# Patient Record
Sex: Female | Born: 1990 | Race: Black or African American | Hispanic: Yes | Marital: Single | Smoking: Never smoker
Health system: Southern US, Community
[De-identification: ages and names within clinical notes are randomized; demographics above are authoritative.]

## PROBLEM LIST (undated history)

## (undated) ENCOUNTER — Inpatient Hospital Stay (HOSPITAL_COMMUNITY): Payer: Self-pay

## (undated) DIAGNOSIS — R569 Unspecified convulsions: Secondary | ICD-10-CM

## (undated) DIAGNOSIS — F32A Depression, unspecified: Secondary | ICD-10-CM

## (undated) DIAGNOSIS — Q796 Ehlers-Danlos syndrome, unspecified: Secondary | ICD-10-CM

## (undated) DIAGNOSIS — N63 Unspecified lump in unspecified breast: Secondary | ICD-10-CM

## (undated) HISTORY — PX: NO PAST SURGERIES: SHX2092

## (undated) HISTORY — PX: BREAST LUMPECTOMY: SHX2

## (undated) HISTORY — DX: Unspecified lump in unspecified breast: N63.0

---
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
## 2011-03-04 ENCOUNTER — Emergency Department (HOSPITAL_COMMUNITY)
Admission: EM | Admit: 2011-03-04 | Discharge: 2011-03-04 | Disposition: A | Discharge status: Home / Self Care | Attending: Emergency Medicine | Admitting: Emergency Medicine

## 2011-03-04 DIAGNOSIS — N390 Urinary tract infection, site not specified: Secondary | ICD-10-CM | POA: Insufficient documentation

## 2011-03-04 DIAGNOSIS — R109 Unspecified abdominal pain: Secondary | ICD-10-CM | POA: Insufficient documentation

## 2011-03-04 DIAGNOSIS — N949 Unspecified condition associated with female genital organs and menstrual cycle: Secondary | ICD-10-CM | POA: Insufficient documentation

## 2011-03-04 LAB — URINALYSIS, ROUTINE W REFLEX MICROSCOPIC
Bilirubin Urine: NEGATIVE
Glucose, UA: NEGATIVE mg/dL
Hgb urine dipstick: NEGATIVE
Ketones, ur: NEGATIVE mg/dL
Nitrite: POSITIVE — AB
Protein, ur: NEGATIVE mg/dL
Specific Gravity, Urine: 1.028 (ref 1.005–1.030)
Urobilinogen, UA: 0.2 mg/dL (ref 0.0–1.0)
pH: 5.5 (ref 5.0–8.0)

## 2011-03-04 LAB — DIFFERENTIAL
Basophils Absolute: 0 10*3/uL (ref 0.0–0.1)
Basophils Relative: 0 % (ref 0–1)
Eosinophils Absolute: 0.1 10*3/uL (ref 0.0–0.7)
Eosinophils Relative: 1 % (ref 0–5)
Lymphocytes Relative: 22 % (ref 12–46)
Lymphs Abs: 1.2 10*3/uL (ref 0.7–4.0)
Monocytes Absolute: 0.3 10*3/uL (ref 0.1–1.0)
Monocytes Relative: 6 % (ref 3–12)
Neutro Abs: 3.8 10*3/uL (ref 1.7–7.7)
Neutrophils Relative %: 71 % (ref 43–77)

## 2011-03-04 LAB — COMPREHENSIVE METABOLIC PANEL
ALT: 5 U/L (ref 0–35)
AST: 12 U/L (ref 0–37)
Albumin: 3.7 g/dL (ref 3.5–5.2)
Alkaline Phosphatase: 56 U/L (ref 39–117)
BUN: 11 mg/dL (ref 6–23)
CO2: 25 mEq/L (ref 19–32)
Calcium: 9 mg/dL (ref 8.4–10.5)
Chloride: 103 mEq/L (ref 96–112)
Creatinine, Ser: 0.64 mg/dL (ref 0.50–1.10)
GFR calc Af Amer: >90 mL/min (ref >90)
GFR calc non Af Amer: >90 mL/min (ref >90)
Glucose, Bld: 81 mg/dL (ref 70–99)
Potassium: 3.7 mEq/L (ref 3.5–5.1)
Sodium: 137 mEq/L (ref 135–145)
Total Bilirubin: 0.5 mg/dL (ref 0.3–1.2)
Total Protein: 6.3 g/dL (ref 6.0–8.3)

## 2011-03-04 LAB — CBC
HCT: 36.9 % (ref 36.0–46.0)
Hemoglobin: 12.8 g/dL (ref 12.0–15.0)
MCH: 26.9 pg (ref 26.0–34.0)
MCHC: 34.7 g/dL (ref 30.0–36.0)
MCV: 77.7 fL — ABNORMAL LOW (ref 78.0–100.0)
Platelets: 222 10*3/uL (ref 150–400)
RBC: 4.75 MIL/uL (ref 3.87–5.11)
RDW: 12.7 % (ref 11.5–15.5)
WBC: 5.4 10*3/uL (ref 4.0–10.5)

## 2011-03-04 LAB — URINE MICROSCOPIC-ADD ON

## 2011-03-04 LAB — WET PREP, GENITAL
Clue Cells Wet Prep HPF POC: NONE SEEN
Trich, Wet Prep: NONE SEEN
Yeast Wet Prep HPF POC: NONE SEEN

## 2011-03-04 LAB — POCT PREGNANCY, URINE: Preg Test, Ur: NEGATIVE

## 2011-03-05 LAB — GC/CHLAMYDIA PROBE AMP, GENITAL
Chlamydia, DNA Probe: NEGATIVE
GC Probe Amp, Genital: NEGATIVE

## 2011-03-06 LAB — URINE CULTURE
Colony Count: >=100000
Culture  Setup Time: 201210121141

## 2011-04-11 ENCOUNTER — Emergency Department (HOSPITAL_COMMUNITY)
Admission: EM | Admit: 2011-04-11 | Discharge: 2011-04-11 | Disposition: A | Discharge status: Home / Self Care | Attending: Emergency Medicine | Admitting: Emergency Medicine

## 2011-04-11 ENCOUNTER — Encounter: Payer: Self-pay | Admitting: Family Medicine

## 2011-04-11 DIAGNOSIS — T887XXA Unspecified adverse effect of drug or medicament, initial encounter: Secondary | ICD-10-CM | POA: Insufficient documentation

## 2011-04-11 DIAGNOSIS — T50905A Adverse effect of unspecified drugs, medicaments and biological substances, initial encounter: Secondary | ICD-10-CM

## 2011-04-11 DIAGNOSIS — I10 Essential (primary) hypertension: Secondary | ICD-10-CM | POA: Insufficient documentation

## 2011-04-11 DIAGNOSIS — R42 Dizziness and giddiness: Secondary | ICD-10-CM | POA: Insufficient documentation

## 2011-04-11 HISTORY — DX: Unspecified convulsions: R56.9

## 2011-04-11 LAB — CBC
HCT: 37 % (ref 36.0–46.0)
Hemoglobin: 11.9 g/dL — ABNORMAL LOW (ref 12.0–15.0)
MCH: 25.3 pg — ABNORMAL LOW (ref 26.0–34.0)
MCHC: 32.2 g/dL (ref 30.0–36.0)
MCV: 78.7 fL (ref 78.0–100.0)
Platelets: 276 10*3/uL (ref 150–400)
RBC: 4.7 MIL/uL (ref 3.87–5.11)
RDW: 12.6 % (ref 11.5–15.5)
WBC: 4.9 10*3/uL (ref 4.0–10.5)

## 2011-04-11 LAB — BASIC METABOLIC PANEL
BUN: 9 mg/dL (ref 6–23)
CO2: 25 mEq/L (ref 19–32)
Calcium: 9.2 mg/dL (ref 8.4–10.5)
Chloride: 105 mEq/L (ref 96–112)
Creatinine, Ser: 0.77 mg/dL (ref 0.50–1.10)
GFR calc Af Amer: >90 mL/min (ref >90)
GFR calc non Af Amer: >90 mL/min (ref >90)
Glucose, Bld: 87 mg/dL (ref 70–99)
Potassium: 3.6 mEq/L (ref 3.5–5.1)
Sodium: 138 mEq/L (ref 135–145)

## 2011-04-11 LAB — URINALYSIS, ROUTINE W REFLEX MICROSCOPIC
Bilirubin Urine: NEGATIVE
Glucose, UA: NEGATIVE mg/dL
Hgb urine dipstick: NEGATIVE
Ketones, ur: NEGATIVE mg/dL
Nitrite: POSITIVE — AB
Protein, ur: NEGATIVE mg/dL
Specific Gravity, Urine: 1.016 (ref 1.005–1.030)
Urobilinogen, UA: 0.2 mg/dL (ref 0.0–1.0)
pH: 7 (ref 5.0–8.0)

## 2011-04-11 LAB — URINE MICROSCOPIC-ADD ON

## 2011-04-11 LAB — RAPID URINE DRUG SCREEN, HOSP PERFORMED
Amphetamines: NOT DETECTED
Barbiturates: NOT DETECTED
Benzodiazepines: NOT DETECTED
Cocaine: NOT DETECTED
Opiates: NOT DETECTED
Tetrahydrocannabinol: NOT DETECTED

## 2011-04-11 LAB — PREGNANCY, URINE: Preg Test, Ur: NEGATIVE

## 2011-04-11 LAB — ETHANOL: Alcohol, Ethyl (B): <11 mg/dL (ref 0–11)

## 2011-04-11 MED ORDER — ONDANSETRON HCL 4 MG PO TABS: 8.0000 mg | ORAL_TABLET | Freq: Once | ORAL | Status: DC

## 2011-04-11 MED ORDER — ONDANSETRON HCL 4 MG PO TABS: 4.0000 mg | ORAL_TABLET | Freq: Four times a day (QID) | ORAL | Status: AC

## 2011-04-11 MED ORDER — ONDANSETRON 8 MG PO TBDP
ORAL_TABLET | ORAL | Status: AC
  Administered 2011-04-11: 21:00:00
  Filled 2011-04-11: qty 1

## 2011-04-11 NOTE — ED Notes (Signed)
Transported to xray 

## 2011-04-11 NOTE — ED Notes (Signed)
Per EMS: pt from school. Reports having dizziness since 15:00 today.

## 2011-04-11 NOTE — ED Notes (Signed)
ZOX:[id number]<AV> Expected date:04/11/11<BR> Expected time: 5:45 PM<BR> Means of arrival:Ambulance<BR> Comments:<BR> EMS 50 GC  20 yof w dizziness/? seizure

## 2011-04-11 NOTE — ED Provider Notes (Signed)
History     CSN: [id number] Arrival date & time: 04/11/2011  5:57 PM   First MD Initiated Contact with Patient 04/11/11 1818      Chief Complaint  Patient presents with  . Dizziness    (Consider location/radiation/quality/duration/timing/severity/associated sxs/prior treatment) HPI Patient reports she was nauseated earlier today and she was given Aleve and Phenergan from her sister.  She currently is feeling dizzy and drowsy.  No longer has nausea.  She denies abdominal pain she denies dysuria.  She denies urinary frequency.  She denies diarrhea.  She is actively on her menstrual period per the patient she reports no current sexual activity.  Nothing worsens her symptoms.  Nothing improves her symptoms.  Her symptoms are mild to moderate in severity.  12  Past Medical History  Diagnosis Date  . Seizures     History reviewed. No pertinent past surgical history.  History reviewed. No pertinent family history.  History  Substance Use Topics  . Smoking status: Never Smoker   . Smokeless tobacco: Not on file  . Alcohol Use: No    OB History    Grav Para Term Preterm Abortions TAB SAB Ect Mult Living                  Review of Systems  All other systems reviewed and are negative.    Allergies  Penicillins  Home Medications   Current Outpatient Rx  Name Route Sig Dispense Refill  . IBUPROFEN 200 MG PO TABS Oral Take 400 mg by mouth every 6 (six) hours as needed. pain       BP 130/82  Pulse 68  Temp(Src) 97.4 F (36.3 C) (Oral)  Resp 16  SpO2 100%  LMP 04/11/2011  Physical Exam  Nursing note and vitals reviewed. Constitutional: She is oriented to person, place, and time. She appears well-developed and well-nourished.  HENT:  Head: Normocephalic and atraumatic.  Eyes: Pupils are equal, round, and reactive to light.  Cardiovascular: Regular rhythm.   Pulmonary/Chest: Effort normal.  Abdominal: Soft.  Neurological: She is alert and oriented to person,  place, and time.       5/5 strength in major muscle groups of  bilateral upper and lower extremities. Speech normal. No facial asymetry.     ED Course  Procedures (including critical care time)  Labs Reviewed  CBC - Abnormal; Notable for the following:    Hemoglobin 11.9 (*)    MCH 25.3 (*)    All other components within normal limits  PREGNANCY, URINE  URINE RAPID DRUG SCREEN (HOSP PERFORMED)  ETHANOL  BASIC METABOLIC PANEL  URINALYSIS, ROUTINE W REFLEX MICROSCOPIC   No results found.   1. Medication reaction       MDM  I suspect this is medication related.  We'll obtain urine and urine pregnancy and basic labs including urine drug and EtOH.  He normal neurologic exam however does appear drowsy.  Her abdomen is benign on exam  9:15 PM Pt is much more alert. Took her sisters phenergan. Labs and urine normal. Home with antiemtics. Mother to take the patient home        Lyanne Co, MD 04/11/11 2116

## 2011-04-12 ENCOUNTER — Encounter (HOSPITAL_COMMUNITY): Payer: Self-pay | Admitting: Emergency Medicine

## 2011-04-12 ENCOUNTER — Emergency Department (INDEPENDENT_AMBULATORY_CARE_PROVIDER_SITE_OTHER)
Admission: EM | Admit: 2011-04-12 | Discharge: 2011-04-12 | Disposition: A | Discharge status: Home / Self Care | Source: Home / Self Care

## 2011-04-12 DIAGNOSIS — B349 Viral infection, unspecified: Secondary | ICD-10-CM

## 2011-04-12 DIAGNOSIS — B9789 Other viral agents as the cause of diseases classified elsewhere: Secondary | ICD-10-CM

## 2011-04-12 HISTORY — DX: Ehlers-Danlos syndrome, unspecified: Q79.60

## 2011-04-12 NOTE — ED Provider Notes (Signed)
History     CSN: [id number] Arrival date & time: 04/12/2011  1:39 PM   None     Chief Complaint  Patient presents with  . Generalized Body Aches    (Consider location/radiation/quality/duration/timing/severity/associated sxs/prior treatment) HPI Comments: Pt states she began feeling lightheaded and weak yesterday. Today has chills, HA and body aches also. Was seen at [facility] ED yesterday. No cough, sore throat, abd pain, N/V/D, dysuria. Does admit to mild nasal congestion.Having normal menses currently. Appetite is normal. Pt and her family are requesting a referral to [facility] to establish with a PCP. Pt states she believes her sister called [facility] for an appt, as she herself did not call, and her sister was told that she needed a referral.    Past Medical History  Diagnosis Date  . Seizures   . Ehler's-Danlos syndrome     History reviewed. No pertinent past surgical history.  History reviewed. No pertinent family history.  History  Substance Use Topics  . Smoking status: Never Smoker   . Smokeless tobacco: Not on file  . Alcohol Use: No    OB History    Grav Para Term Preterm Abortions TAB SAB Ect Mult Living                  Review of Systems  Constitutional: Positive for fever, chills and fatigue. Negative for appetite change.  HENT: Positive for congestion. Negative for ear pain, sore throat, rhinorrhea, postnasal drip and sinus pressure.   Respiratory: Negative for cough and shortness of breath.   Cardiovascular: Negative for chest pain.  Gastrointestinal: Positive for nausea (mild, intermittent). Negative for vomiting, abdominal pain, diarrhea and constipation.  Genitourinary: Negative for dysuria, urgency, frequency, decreased urine volume and menstrual problem.    Allergies  Penicillins  Home Medications   Current Outpatient Rx  Name Route Sig Dispense Refill  . IBUPROFEN 200 MG PO TABS Oral Take 400 mg by mouth every 6 (six) hours  as needed. pain     . ONDANSETRON HCL 4 MG PO TABS Oral Take 1 tablet (4 mg total) by mouth every 6 (six) hours. 12 tablet 0    BP 95/59  Pulse 134  Temp(Src) 100.7 F (38.2 C) (Oral)  Resp 20  SpO2 100%  LMP 04/11/2011  Physical Exam  Nursing note and vitals reviewed. Constitutional: She appears well-developed and well-nourished. No distress.  HENT:  Head: Normocephalic and atraumatic.  Right Ear: Tympanic membrane, external ear and ear canal normal.  Left Ear: Tympanic membrane, external ear and ear canal normal.  Nose: Nose normal.  Mouth/Throat: Uvula is midline, oropharynx is clear and moist and mucous membranes are normal. No oropharyngeal exudate, posterior oropharyngeal edema or posterior oropharyngeal erythema.  Neck: Neck supple.  Cardiovascular: Normal rate, regular rhythm and normal heart sounds.   Pulmonary/Chest: Effort normal and breath sounds normal. No respiratory distress.  Abdominal: Normal appearance and bowel sounds are normal. She exhibits no mass. There is no hepatosplenomegaly. There is no tenderness. There is no CVA tenderness.  Lymphadenopathy:    She has no cervical adenopathy.  Neurological: She is alert.  Skin: Skin is warm and dry.  Psychiatric: She has a normal mood and affect.    ED Course  Procedures (including critical care time)  Labs Reviewed - No data to display No results found.   1. Viral infection       MDM  Low grade fever, with neg exam. Pt eating and drinking well.  ED visit yesterday, including labs reviewed.         Melody Comas, [place] 04/12/11 [phone]

## 2011-04-12 NOTE — ED Provider Notes (Signed)
Medical screening examination/treatment/procedure(s) were performed by non-physician practitioner and as supervising physician I was immediately available for consultation/collaboration.  Luiz Blare MD   Luiz Blare, MD 04/12/11 2224

## 2011-04-12 NOTE — ED Notes (Signed)
Patient reports getting up and coming directly to [facility].  Patient has not eat today.  Reports she just got up and came directly over. Patient reports a month of intermittent not feeling well, intermittent cold symptoms: runny nose, cough, generalized weakness.  Today reports "shaking, weak, and dizzy".  Yesterday had nausea and headache.  Patient reports father told her she had similar symptoms prior to seizures as a child.    Patient has a hand written note from older sister requesting referral to [facility].  Per hand written letter, family concerned about possibility of seizures.  Patient has not had seizure since being an adult

## 2011-05-23 IMAGING — CR DG CHEST 2V
2 series · 2 of 2 positions shown · non-contrast
Comparison: 09/14/2007.

CLINICAL DATA: Right-sided chest pain.

CHEST - 2 VIEW

[view not recorded (1 of 2)]
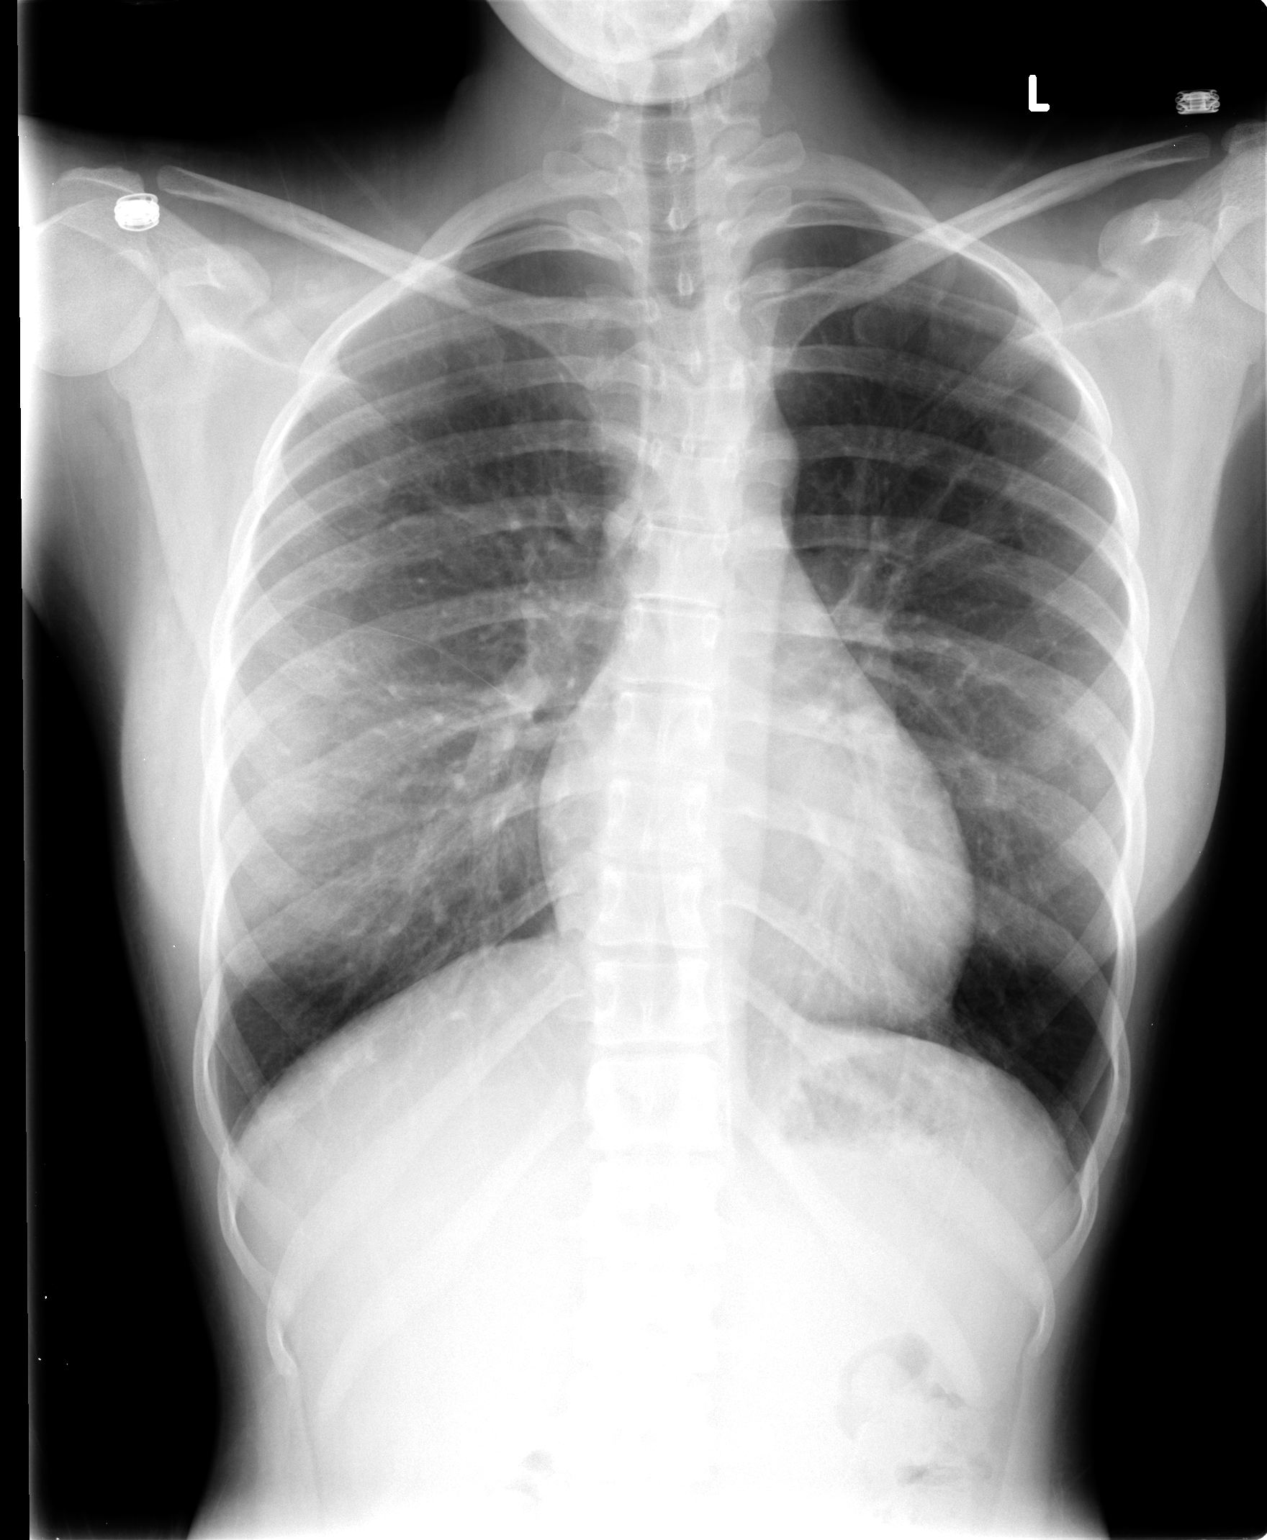

[view not recorded (2 of 2)]
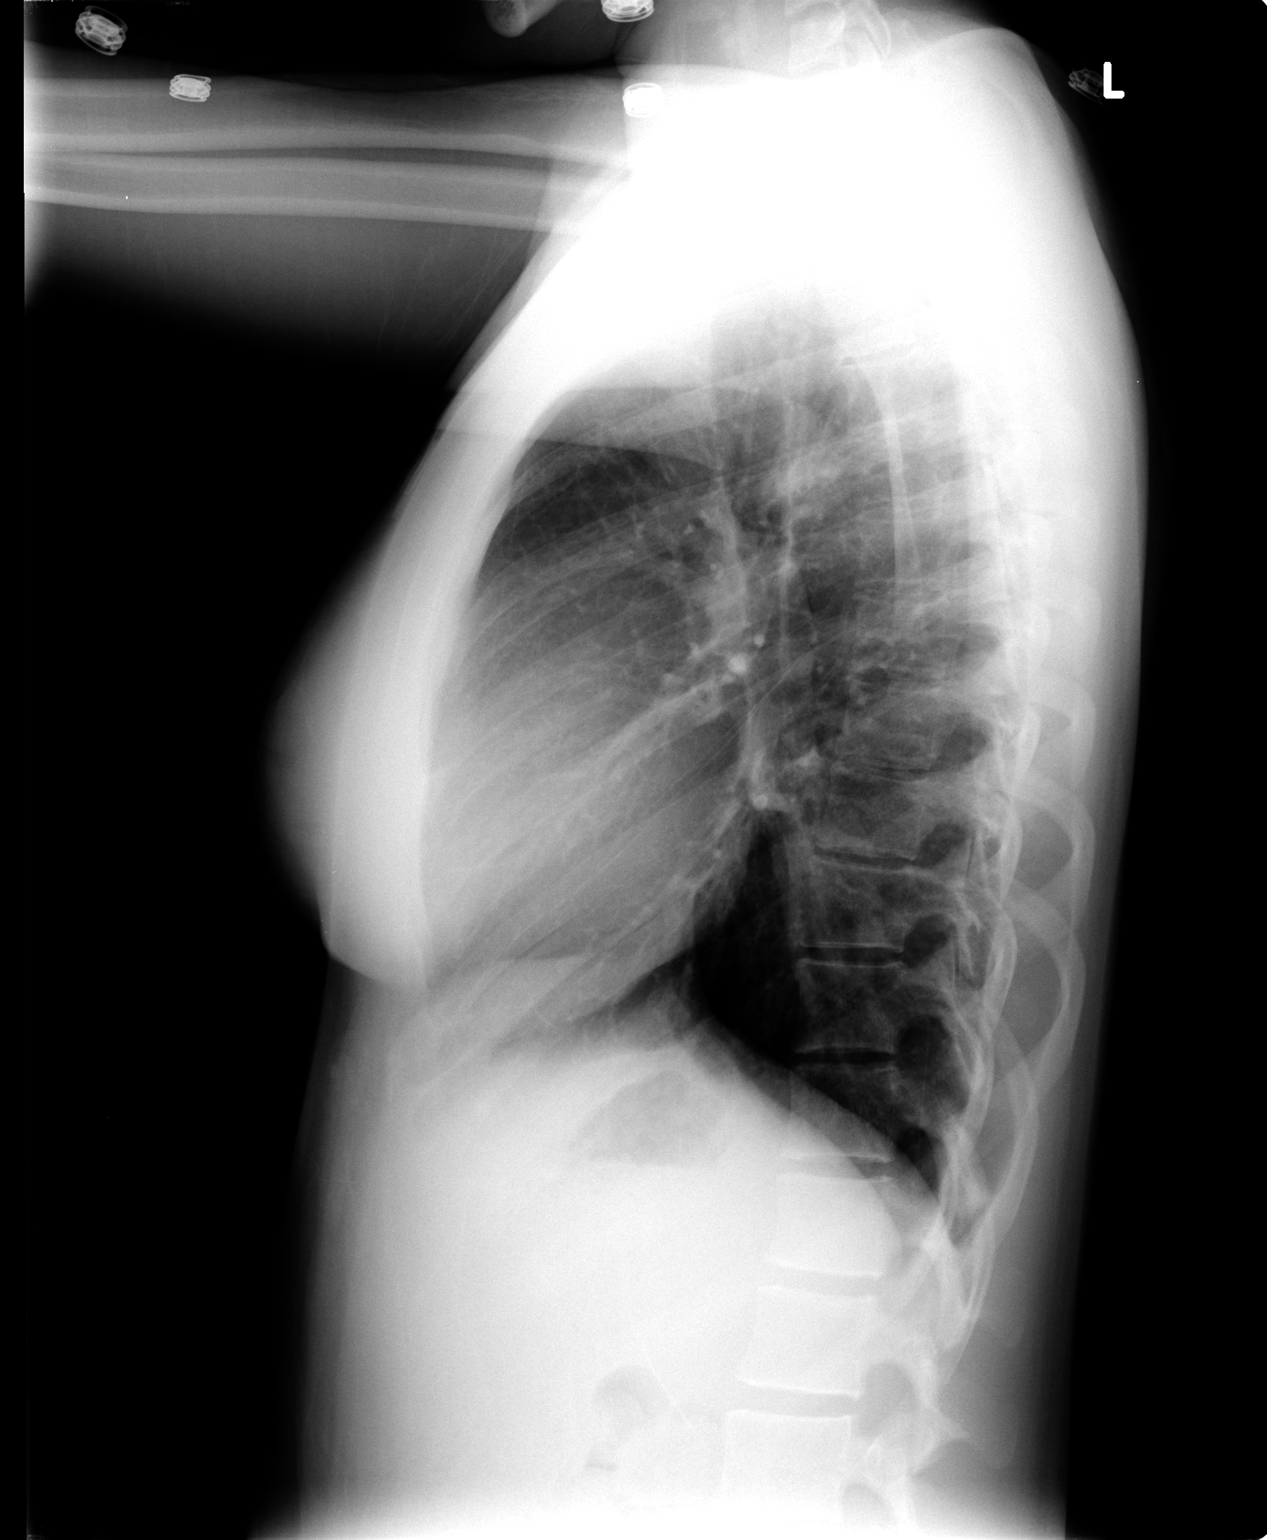

[2 of 2 positions shown; findings below may reference images not displayed]

FINDINGS: The lungs are clear without focal consolidation, edema,
effusion or pneumothorax.  Cardiopericardial silhouette is within
normal limits for size.  Imaged bony structures of the thorax are
intact.
IMPRESSION: No acute cardiopulmonary process.

## 2011-11-11 ENCOUNTER — Emergency Department (INDEPENDENT_AMBULATORY_CARE_PROVIDER_SITE_OTHER)
Admission: EM | Admit: 2011-11-11 | Discharge: 2011-11-11 | Disposition: A | Discharge status: Home / Self Care | Source: Home / Self Care | Attending: Emergency Medicine | Admitting: Emergency Medicine

## 2011-11-11 ENCOUNTER — Encounter (HOSPITAL_COMMUNITY): Payer: Self-pay

## 2011-11-11 DIAGNOSIS — J020 Streptococcal pharyngitis: Secondary | ICD-10-CM

## 2011-11-11 LAB — POCT RAPID STREP A: Streptococcus, Group A Screen (Direct): POSITIVE — AB

## 2011-11-11 MED ORDER — AZITHROMYCIN 250 MG PO TABS: ORAL_TABLET | ORAL | Status: DC

## 2011-11-11 NOTE — ED Notes (Signed)
C/o pain inleft ear, pain w swallowing, both tonsils swollen, exudative, uvula midline

## 2011-11-11 NOTE — ED Provider Notes (Signed)
Medical screening examination/treatment/procedure(s) were performed by non-physician practitioner and as supervising physician I was immediately available for consultation/collaboration.  Leslee Home, M.D.   Reuben Likes, MD 11/11/11 [phone]

## 2011-11-11 NOTE — ED Provider Notes (Signed)
History     CSN: [id number]  Arrival date & time 11/11/11  1334   First MD Initiated Contact with Patient 11/11/11 1531      Chief Complaint  Patient presents with  . Sore Throat    (Consider location/radiation/quality/duration/timing/severity/associated sxs/prior treatment) Patient is a 21 y.o. female presenting with pharyngitis. The history is provided by the patient. No language interpreter was used.  Sore Throat This is a new problem. The current episode started 2 days ago. The problem occurs constantly. The problem has not changed since onset.Nothing aggravates the symptoms. Nothing relieves the symptoms.   patient complains of left ear pain. Patient complains of swollen tonsils  Past Medical History  Diagnosis Date  . Seizures   . Ehler's-Danlos syndrome     History reviewed. No pertinent past surgical history.  History reviewed. No pertinent family history.  History  Substance Use Topics  . Smoking status: Never Smoker   . Smokeless tobacco: Not on file  . Alcohol Use: No    OB History    Grav Para Term Preterm Abortions TAB SAB Ect Mult Living                  Review of Systems  All other systems reviewed and are negative.    Allergies  Penicillins  Home Medications   Current Outpatient Rx  Name Route Sig Dispense Refill  . IBUPROFEN 200 MG PO TABS Oral Take 400 mg by mouth every 6 (six) hours as needed. pain       BP 109/75  Pulse 96  Temp 100.2 F (37.9 C) (Oral)  Resp 18  SpO2 97%  LMP 11/06/2011  Physical Exam  Constitutional: She appears well-developed and well-nourished.  HENT:  Head: Normocephalic and atraumatic.  Right Ear: External ear normal.  Left Ear: External ear normal.  Mouth/Throat: Oropharyngeal exudate present.       Swollen bilateral tonsils small amount of exudate  Eyes: Conjunctivae and EOM are normal. Pupils are equal, round, and reactive to light.  Neck: Normal range of motion. Neck supple.  Cardiovascular:  Normal rate.   Pulmonary/Chest: Effort normal.  Abdominal: Soft.  Musculoskeletal: Normal range of motion.  Neurological: She is alert.  Skin: Skin is warm.  Psychiatric: She has a normal mood and affect.    ED Course  Procedures (including critical care time)  Labs Reviewed  POCT RAPID STREP A ([facility] CARE ONLY) - Abnormal; Notable for the following:    Streptococcus, Group A Screen (Direct) POSITIVE (*)     All other components within normal limits   No results found.   No diagnosis found.    MDM  zithrmoax pack        Lonia Skinner Haynes, [place] 11/11/11 1535

## 2011-11-11 NOTE — Discharge Instructions (Signed)

## 2012-01-14 ENCOUNTER — Encounter (HOSPITAL_COMMUNITY): Payer: Self-pay | Admitting: Family Medicine

## 2012-01-14 ENCOUNTER — Emergency Department (HOSPITAL_COMMUNITY)

## 2012-01-14 ENCOUNTER — Emergency Department (HOSPITAL_COMMUNITY)
Admission: EM | Admit: 2012-01-14 | Discharge: 2012-01-14 | Disposition: A | Discharge status: Home / Self Care | Attending: Emergency Medicine | Admitting: Emergency Medicine

## 2012-01-14 DIAGNOSIS — Z3490 Encounter for supervision of normal pregnancy, unspecified, unspecified trimester: Secondary | ICD-10-CM

## 2012-01-14 DIAGNOSIS — O200 Threatened abortion: Secondary | ICD-10-CM | POA: Insufficient documentation

## 2012-01-14 LAB — CBC WITH DIFFERENTIAL/PLATELET
Basophils Absolute: 0 10*3/uL (ref 0.0–0.1)
Basophils Relative: 0 % (ref 0–1)
Eosinophils Absolute: 0 10*3/uL (ref 0.0–0.7)
Eosinophils Relative: 1 % (ref 0–5)
HCT: 39.5 % (ref 36.0–46.0)
Hemoglobin: 13.7 g/dL (ref 12.0–15.0)
Lymphocytes Relative: 18 % (ref 12–46)
Lymphs Abs: 1.2 10*3/uL (ref 0.7–4.0)
MCH: 26.6 pg (ref 26.0–34.0)
MCHC: 34.7 g/dL (ref 30.0–36.0)
MCV: 76.7 fL — ABNORMAL LOW (ref 78.0–100.0)
Monocytes Absolute: 0.3 10*3/uL (ref 0.1–1.0)
Monocytes Relative: 5 % (ref 3–12)
Neutro Abs: 4.8 10*3/uL (ref 1.7–7.7)
Neutrophils Relative %: 76 % (ref 43–77)
Platelets: 269 10*3/uL (ref 150–400)
RBC: 5.15 MIL/uL — ABNORMAL HIGH (ref 3.87–5.11)
RDW: 13.1 % (ref 11.5–15.5)
WBC: 6.3 10*3/uL (ref 4.0–10.5)

## 2012-01-14 LAB — URINALYSIS, ROUTINE W REFLEX MICROSCOPIC
Bilirubin Urine: NEGATIVE
Glucose, UA: NEGATIVE mg/dL
Ketones, ur: NEGATIVE mg/dL
Nitrite: NEGATIVE
Protein, ur: NEGATIVE mg/dL
Specific Gravity, Urine: 1.011 (ref 1.005–1.030)
Urobilinogen, UA: 0.2 mg/dL (ref 0.0–1.0)
pH: 6.5 (ref 5.0–8.0)

## 2012-01-14 LAB — WET PREP, GENITAL
Trich, Wet Prep: NONE SEEN
Yeast Wet Prep HPF POC: NONE SEEN

## 2012-01-14 LAB — HCG, QUANTITATIVE, PREGNANCY: hCG, Beta Chain, Quant, S: 94446 m[IU]/mL — ABNORMAL HIGH (ref <5)

## 2012-01-14 LAB — ABO/RH: ABO/RH(D): O POS

## 2012-01-14 LAB — PREGNANCY, URINE: Preg Test, Ur: POSITIVE — AB

## 2012-01-14 LAB — URINE MICROSCOPIC-ADD ON

## 2012-01-14 NOTE — ED Provider Notes (Signed)
History     CSN: [id number]  Arrival date & time 01/14/12  1107   First MD Initiated Contact with Patient 01/14/12 1136      Chief Complaint  Patient presents with  . Miscarriage    (Consider location/radiation/quality/duration/timing/severity/associated sxs/prior treatment) Patient is a 21 y.o. female presenting with vaginal bleeding. The history is provided by the patient.  Vaginal Bleeding This is a new problem. The current episode started today. The problem has been unchanged. Pertinent negatives include no abdominal pain, fever, nausea or vomiting. Associated symptoms comments: She reports lower abdominal pain yesterday that resolved overnight. This morning she started bleeding vaginally, passing clots. No dysuria, fever. She is currently about [redacted] weeks pregnant without prenatal care as of yet.Marland Kitchen    History reviewed. No pertinent past medical history.  History reviewed. No pertinent past surgical history.  History reviewed. No pertinent family history.  History  Substance Use Topics  . Smoking status: Never Smoker   . Smokeless tobacco: Not on file  . Alcohol Use: No    OB History    Grav Para Term Preterm Abortions TAB SAB Ect Mult Living                  Review of Systems  Constitutional: Negative for fever.  Respiratory: Negative for shortness of breath.   Gastrointestinal: Negative for nausea, vomiting and abdominal pain.  Genitourinary: Positive for vaginal bleeding. Negative for dysuria.  Musculoskeletal: Negative for back pain.  Neurological: Negative for dizziness and syncope.    Allergies  Penicillins  Home Medications   Current Outpatient Rx  Name Route Sig Dispense Refill  . PRENATAL MULTIVITAMIN CH Oral Take 1 tablet by mouth daily.      BP 100/64  Pulse 75  Temp 99.7 F (37.6 C) (Oral)  Resp 20  SpO2 100%  LMP 11/07/2011  Physical Exam  Constitutional: She is oriented to person, place, and time. She appears well-developed and  well-nourished. No distress.  Neck: Normal range of motion.  Cardiovascular: Normal rate and regular rhythm.   No murmur heard. Pulmonary/Chest: Effort normal. She has no wheezes. She has no rales.  Abdominal: Soft. There is no tenderness. There is no rebound.  Genitourinary:       Scant light bleeding from closed cervical os. Mild uterine tenderness. No adnexal tenderness or fullness.   Neurological: She is alert and oriented to person, place, and time.  Skin: Skin is warm and dry.  Psychiatric: She has a normal mood and affect.    ED Course  Procedures (including critical care time)  Labs Reviewed  URINALYSIS, ROUTINE W REFLEX MICROSCOPIC - Abnormal; Notable for the following:    APPearance CLOUDY (*)     Hgb urine dipstick LARGE (*)     Leukocytes, UA MODERATE (*)     All other components within normal limits  PREGNANCY, URINE - Abnormal; Notable for the following:    Preg Test, Ur POSITIVE (*)     All other components within normal limits  CBC WITH DIFFERENTIAL - Abnormal; Notable for the following:    RBC 5.15 (*)     MCV 76.7 (*)     All other components within normal limits  HCG, QUANTITATIVE, PREGNANCY - Abnormal; Notable for the following:    hCG, Beta Chain, Quant, S [id number] (*)     All other components within normal limits  WET PREP, GENITAL - Abnormal; Notable for the following:    Clue Cells Wet Prep HPF POC MODERATE (*)  WBC, Wet Prep HPF POC FEW (*)     All other components within normal limits  URINE MICROSCOPIC-ADD ON - Abnormal; Notable for the following:    Squamous Epithelial / LPF FEW (*)     All other components within normal limits  ABO/RH  GC/CHLAMYDIA PROBE AMP, GENITAL   No results found.  US Ob Comp Less 14 Wks  01/14/2012  *RADIOLOGY REPORT*  Clinical Data: Pregnant, bleeding  OBSTETRIC <14 WK ULTRASOUND  Technique:  Transabdominal ultrasound was performed for evaluation of the gestation as well as the maternal uterus and adnexal regions.   Comparison:  None.  Intrauterine gestational sac: Visualized/normal in shape. Yolk sac: Present Embryo: Present Cardiac Activity: Present Heart Rate: 169 bpm  CRL:  23.1 mm  9 w  0 d [place] EDC: 08/18/2012  Maternal uterus/Adnexae: Small subchronic hemorrhage.  Left ovary is within normal limits, measuring 1.9 x 3.2 x 2.8 cm.  Right ovary is within normal limits, measuring 1.6 x 4.2 x 2.5 cm.  No free fluid.  IMPRESSION: Single live intrauterine gestation with estimated gestational age of [redacted] weeks 0 days by crown-rump length.   Original Report Authenticated By: Charline Bills, M.D.    No diagnosis found.  1. Threatened miscarriage.  MDM  US shows [redacted]w[redacted]d IUP with cardiac activity. Patient stable otherwise. Will discharge home, encouraged prenatal care. Advised patient of accessibility of [facility].        Rodena Medin, [place] 01/14/12 1520

## 2012-01-14 NOTE — ED Notes (Signed)
Per pt sts bleeding this am. Pt [redacted] weeks pregnant. sts moderate bleeding with clots.

## 2012-01-14 NOTE — ED Notes (Signed)
Pt reports she had light pink spotting 2 day ago, [facility] - [facility] told her is isn't normal for her to have spotting but it is common, pt reports she didn't go into doctor office because she wasn't that worried. Pt sts this morning she began to have bright red vaginal discharge, mainly when she wipes.

## 2012-01-14 NOTE — ED Provider Notes (Signed)
Medical screening examination/treatment/procedure(s) were performed by non-physician practitioner and as supervising physician I was immediately available for consultation/collaboration.  Joana Nolton, MD 01/14/12 1556 

## 2012-01-17 ENCOUNTER — Encounter (HOSPITAL_COMMUNITY): Payer: Self-pay

## 2012-01-17 LAB — GC/CHLAMYDIA PROBE AMP, GENITAL
Chlamydia, DNA Probe: NEGATIVE
GC Probe Amp, Genital: NEGATIVE

## 2012-03-01 ENCOUNTER — Other Ambulatory Visit: Payer: Self-pay

## 2012-03-01 LAB — OB RESULTS CONSOLE RPR: RPR: NONREACTIVE

## 2012-03-01 LAB — OB RESULTS CONSOLE HIV ANTIBODY (ROUTINE TESTING): HIV: NONREACTIVE

## 2012-03-01 LAB — OB RESULTS CONSOLE HEPATITIS B SURFACE ANTIGEN: Hepatitis B Surface Ag: NEGATIVE

## 2012-03-01 LAB — OB RESULTS CONSOLE ABO/RH: RH Type: POSITIVE

## 2012-03-01 LAB — OB RESULTS CONSOLE ANTIBODY SCREEN: Antibody Screen: NEGATIVE

## 2012-03-01 LAB — OB RESULTS CONSOLE RUBELLA ANTIBODY, IGM: Rubella: IMMUNE

## 2012-03-07 ENCOUNTER — Other Ambulatory Visit (HOSPITAL_COMMUNITY): Payer: Self-pay | Admitting: Obstetrics and Gynecology

## 2012-03-07 DIAGNOSIS — Z3689 Encounter for other specified antenatal screening: Secondary | ICD-10-CM

## 2012-03-07 DIAGNOSIS — Q796 Ehlers-Danlos syndrome, unspecified: Secondary | ICD-10-CM

## 2012-03-07 DIAGNOSIS — G40909 Epilepsy, unspecified, not intractable, without status epilepticus: Secondary | ICD-10-CM

## 2012-03-08 ENCOUNTER — Encounter (HOSPITAL_COMMUNITY): Payer: Self-pay | Admitting: Obstetrics and Gynecology

## 2012-03-20 ENCOUNTER — Ambulatory Visit (HOSPITAL_COMMUNITY)
Admission: RE | Admit: 2012-03-20 | Discharge: 2012-03-20 | Disposition: A | Discharge status: Home / Self Care | Payer: Medicaid Other | Source: Ambulatory Visit | Attending: Obstetrics and Gynecology | Admitting: Obstetrics and Gynecology

## 2012-03-20 ENCOUNTER — Encounter (HOSPITAL_COMMUNITY): Payer: Self-pay

## 2012-03-20 DIAGNOSIS — G40909 Epilepsy, unspecified, not intractable, without status epilepticus: Secondary | ICD-10-CM

## 2012-03-20 DIAGNOSIS — O99350 Diseases of the nervous system complicating pregnancy, unspecified trimester: Secondary | ICD-10-CM

## 2012-03-20 DIAGNOSIS — Z3689 Encounter for other specified antenatal screening: Secondary | ICD-10-CM

## 2012-03-20 DIAGNOSIS — O358XX0 Maternal care for other (suspected) fetal abnormality and damage, not applicable or unspecified: Secondary | ICD-10-CM | POA: Insufficient documentation

## 2012-03-20 DIAGNOSIS — Z1389 Encounter for screening for other disorder: Secondary | ICD-10-CM | POA: Insufficient documentation

## 2012-03-20 DIAGNOSIS — Q7962 Hypermobile Ehlers-Danlos syndrome: Secondary | ICD-10-CM

## 2012-03-20 DIAGNOSIS — Z363 Encounter for antenatal screening for malformations: Secondary | ICD-10-CM | POA: Insufficient documentation

## 2012-03-20 DIAGNOSIS — Q796 Ehlers-Danlos syndrome, unspecified: Secondary | ICD-10-CM

## 2012-03-20 NOTE — Progress Notes (Signed)
Patient : Jodi Dennis; 21 y.o. MRN# [id number] [facility] Attending: Sherron Monday, MD Consult Date: 03/20/2012 -  Consult for: Ehlers-Danlos in Pregnancy  HPI: Jodi Dennis is a 21 y.o. G1P0000 at [redacted]w[redacted]d with singleton pregnancy complicated by maternal history of Ehlers-Danlos, type III.  Since becoming pregnant, patient reports she has had no joint pain, contractions, dyspnea on exertion, or pelvic pressure.  A discussion of my impressions and salient recommendations follows.  Allergy: Penicillins and Penicillins Patient denies food, latex or environmental allergies.  Current Medications: Current Outpatient Prescriptions on File Prior to Encounter  Medication Sig Dispense Refill  . azithromycin (ZITHROMAX Z-PAK) 250 MG tablet 2 tablets day one then one tablet a day 2-5  6 each  0  . ibuprofen (ADVIL,MOTRIN) 200 MG tablet Take 400 mg by mouth every 6 (six) hours as needed. pain       . Prenatal Vit-Fe Fumarate-FA (PRENATAL MULTIVITAMIN) TABS Take 1 tablet by mouth daily.          Past Medical History: Past Medical History  Diagnosis Date  . Seizures   . Ehler's-Danlos syndrome   1.  Seizures:  Patient reports that she had febrile seizures as a child around the age of 21.  However, she has not had any seizures in either adolescence or adulthood and does not require antiepileptic medication.  In my opinion, she does not currently have a seizure disorder in need of medical intervention.  However, should she have a seizure, I recommend immediate consultation with neurology and referral for reassessment and recommendation by MFM. 2. Ehlers-Danlos:  Type III.  Never had an echocardiogram to assess aortic root dilation.  Diagnosis made as a child.  Symptoms limited to joint hypermobility.  Past Surgical History: No past surgical history on file.   Past OB/GYN History: OB History    Grav Para Term Preterm Abortions TAB SAB Ect Mult Living   1 0 0 0 0 0 0 0 0 0         Social History: History   Social History  . Marital Status: Single    Spouse Name: N/A    Number of Children: N/A  . Years of Education: N/A   Social History Main Topics  . Smoking status: Never Smoker   . Smokeless tobacco: Not on file  . Alcohol Use: No  . Drug Use:   . Sexually Active:    Other Topics Concern  . Not on file   Social History Narrative   ** Merged History Encounter **     Family History: Dad, paternal aunt, and half-brother all with Ehlers-Danlos, type III.  Review of Systems: Pertinent items are noted in HPI.   Physical Exam: There were no vitals filed for this visit. Gen:  Alert and oriented, well-appearing, female HEENT:  NCAT Abd:  Gravid, NT Extr:  No edema Neuro: grossly intact. Bedside ultrasound: An active singleton fetus is observed. Biometry is appropriate for gestational age. Amniotic fluid volume is normal. Screening survey of the fetal anatomy was performed and no dysmorphic features are detected. Transabdominal imaging demonstrates and long and closed cervix, measuring 3.29 cm.  Images of the RVOT and ductal arch are suboptimal and warrant follow up assessment to complete our survey.  Discussion  I spoke to your patient regarding management of pregnancy in context of the condition, Ehlers-Danlos syndrome.  The patient reports that she has been formally diagnosed with Ehlers-Danlos Type III.  As you well know, Ehlers-Danlos Type III is described as  joint hypermobility being the primary manifestation. The skin can have some mild hyperextensible changes as well. Subluxations or dislocations of joints can be common and may occur spontaneously or with trauma. Degenerative joint disease and chronic pain are complications of this type of Ehlers-Danlos.   Although she is not currently experiencing generalized joint pain, I explained this a risk during her pregnancy, especially in the lumbar spine region due to the lordosis caused by  pregnancy. My recommendation is for her to start physical therapy early if there is any notation of symptoms of joint pain this pregnancy to help minimize any increased symptoms of pain. Prior to experiencing any joint pain, my recommendation is to use a pregnancy belt for lower back support and promotion of good posture.  Additionally, I recommend that she sleep in the lateral decubitus position with a pillow between her knees to promote a good spine angle during rest at night.    I explained even though she has been diagnosed with Type III Ehlers-Danlos I recommend a maternal echocardiogram to evaluate the aortic root for dilation. This is associated with Ehlers-Danlos Type IV, the vascular type which she hasn't been diagnosed with.  We explained Type III Ehlers-Danlos is a clinical diagnosis because there is no definitive genetic test for this. Therefore, because aortic root dilation can be life-threatening during pregnancy due to physiologic changes which occur during pregnancy, we recommend having this evaluated as a precaution to avoid complication of rupture in labor of an undiagnosed aortic root dilation.   More commonly found observations are that of a risk of preterm delivery due to cervical insufficiency.  Her transabdominal ultrasound images today demonstrate a long and closed cervix, but this assessment is very early in the midtrimester.  As such, I recommend a formal assessment of cervical length by endovaginal ultrasound between 22 0/7 weeks and 23 6/[redacted] weeks gestational age.  This time of screening has been well-validated in multicenter trials as appropriate interval for risk assessment for preterm delivery in women without history of second trimester pregnancy loss due to cervical insufficiency.  Lastly, because of the EDS, many women affected have been noted to have shorter intervals to delivery once labor has been diagnosed.  While the skin hyperdistensibility is in theory desirable with  respect to perineal stretching, there is higher risk of extension, poor healing, and wound breakdown in context of a noted vaginal/perineal laceration at time of delivery.  Careful attention should be paid to protection of the perineum while maneuvers for facilitating vaginal delivery are performed.  Your patient seemed to have an adequate grasp of my impressions and rationale behind by recommendations.  All of her questions were answered during today's encounter.   Impressions:  Jodi Dennis is a 21 y.o. G1P0000 at [redacted]w[redacted]d -  1. Singleton gestation; 2. Maternal Ehlers-Danlos Type III; 3. Childhood history of febrile seizures, no current seizure disorder under medical management.   Summary Recommendations 1. Pregnancy belt for lumbar support. 2. Sleeping position in lateral decubitus with a pillow between her knees for proper spine angle 3. Referral to physical therapy if there is onset of back or joint pain. 4. Follow up evaluation in 4 weeks by ultrasound to complete anatomic survey (RVOT, ductal arch) and plot fetal growth (only since we need to complete anatomic survey). 5. Formally assess cervical length by endovaginal ultrasound during a more predictive period for assessment of risk for preterm delivery in a patient without history of preterm delivery but deemed at risk due to medical  history of EDS (22 0/7 to 23 6/7 weeks). 6. Recommend referral to cardiology for maternal echocardiogram (precautionary assessment to rule out aortic root dilation). 7. Early childhood, febrile seizures:  Patient reports that she had febrile seizures as a child around the age of 75.  However, she has not had any seizures in either adolescence or adulthood and does not require antiepileptic medication.  In my opinion, she does not currently have a seizure disorder in need of medical intervention.  However, should she have a seizure, I recommend immediate consultation with neurology and referral for reassessment  and recommendation by MFM.  I spent in excess of 60 minutes in review of medical records, evaluation, and education of your patient in consultation.  More than 50% of this time was spent in direct face-to-face counseling.  It was a pleasure seeing your patient today in consultation.  Thank you for allowing [place] the opportunity to contribute to the care of your patient.  Page with questions.  Merideth Abbey, MD, MS, FACOG Assistant Professor, Maternal-Fetal Medicine

## 2012-03-20 NOTE — Progress Notes (Signed)
Ms. Roselli had an ultrasound appointment today.  Please see [facility] report for details.  Comments An active singleton fetus is observed.  Biometry is appropriate for gestational age.  Amniotic fluid volume is normal.  Screening survey of the fetal anatomy was performed and no dysmorphic features are detected. Transabdominal imaging demonstrates and long and closed cervix, measuring 3.29 cm  Impression Active singleton fetus in gestation complicated by maternal Ehler's Danlos, Type III. Normal anatomic survey, incomplete Images of the RVOT and ductal arch are suboptimal and warrant follow up assessment to complete our survey.  Recommendations 1. Follow up evaluation in 4 weeks by ultrasound to complete anatomic survey (RVOT, ductal arch), plot fetal growth, and formally assess cervical length by endovaginal ultrasound during a more predictive period for assessment of risk for preterm delivery in a patient without history of preterm delivery but deemed at risk due to medical history of EDS (22 0/7 to 23 6/7 weeks). 2. Recommend referral to cardiology for maternal echocardiogram (precautionary assessment to rule out aortic root dilation). 3. Follow up as clinically indicated (see full MFM consultation note)  Rogelia Boga, MD, MS, FACOG Assistant Professor Section of Maternal-Fetal Medicine [facility]

## 2012-03-22 ENCOUNTER — Encounter (HOSPITAL_COMMUNITY): Payer: Self-pay

## 2012-03-22 NOTE — Progress Notes (Signed)
Genetic Counseling  High-Risk Gestation Note  Appointment Date:  03/20/2012 Referred By: Sherron Monday, MD Date of Birth:  05-Feb-1991    Pregnancy History: G1P0000 Estimated Date of Delivery: 08/20/12 Estimated Gestational Age: [redacted]w[redacted]d Attending: Damaris Hippo, MD   Ms. Jodi Dennis was seen for genetic counseling because of a personal history of Ehlers-Danlos syndrome. She was accompanied by a friend to today's visit.      Both family histories were reviewed and found to be contributory for Ehlers-Danlos syndrome (EDS). Ms. Coonrod reported that she was diagnosed with type III Ehlers-Danlos at age 51 or 21 years old. She has not had regular follow-up regarding this diagnosis and does not see a physician for EDS currently. She was unsure of where she was evaluated or who determined the diagnosis but reported that it was in [place]. She reported that her primary symptoms of EDS are joint problems. She stated that her wrist cramps after writing for a certain period of time. She played sports in high school, running cross country and cheerleading. She reported that her father has a diagnosis of type III EDS, diagnosed in adolescence. He reportedly has more joint problems and is currently 21 years old. Ms. Wroblewski reported her paternal aunt also has EDS but that it is a more severe type. This aunt reportedly has very stretchy skin and gets tired walking short distances. She has used a wheelchair since her 76's and is currently 21 years old. She also reportedly has many doctor visits, but the patient could not recall the specific medical issues for her aunt at the time of today's visit. Ms. Facemire also reported her paternal half-brother, age 43 years old, has a diagnosis of EDS. No additional relatives were reported with a known diagnosis of EDS. We do not currently have medical records confirming Ms. Sundby' diagnosis and the specific type of EDS for her or her relatives. The patient planned to  attempt to obtain medical records from her father given that she was unsure of where her diagnosis was made and where to request medical records.   Ehlers-Danlos syndromes (EDS) are a group of heritable connective tissue disorders characterized by joint laxity and specific skin findings. Previous classifications delineated 11 types of EDS, with more recent classifications including six major types. Given the reported history of EDS type III (hypermobility type) in the family, we primarily discussed this particular type of EDS.   Hypermobility type Ehlers-Danlos syndrome (type III) is generally considered to be the least severe type of EDS and is characterized by soft, velvety skin that may be mildly hyperextensible, joint hypermobility with subluxations and dislocations, degenerative joint disease, chronic pain, and easy bruising. Additionally, functional bowel disorders, autonomic dysfunction (including POTS), aortic root dilation, and psychological problems may also be seen.  EDS shows variability, even within families. We discussed that the type of EDS for the various individuals in the patient's family would be expected to be the same, even in the case of different features for the individuals. It would be much less common to have two different types of EDS present in a family.   Regarding pregnancy, individuals with type III EDS (hypermobility type) are reported to have an increased risk for premature rupture of membranes or rapid labor and delivery, but this association is reported to be less than the classic type of EDS. Cesarean delivery is reported to possibly reduce the risk of hip dislocation but there is no clear advantage to vaginal vs. cesarean delivery reported for women with  type III EDS. Ms. Kacelynn Amyot was also seen for Maternal Fetal Medicine consultation at the time of today's visit to discuss pregnancy management regarding her diagnosis of EDS. See separate consult note for detailed  discussion.   We discussed that the majority of EDS follow autosomal dominant inheritance. We reviewed genes, autosomal dominant inheritance, and autosomal recessive inheritance, given that other forms of EDS are reported to follow autosomal recessive inheritance. We discussed that EDS type III (hypermobility type) follows autosomal dominant inheritance, and the reported pattern of EDS in the patient's family history is consistent with autosomal dominant inheritance. We discussed that in autosomal dominant (AD) conditions, an individual with the condition has one copy of a nonworking gene change (mutation). Males and females have equal chance to inherit the condition. Each offspring of an individual with the condition has a 1 in 2 (50%) chance to inherit the condition. Thus, based on the available information reported by the patient regarding the diagnosis of EDS, the current pregnancy would have a 1 in 2 (50%) chance to also inherit type III EDS. Less commonly, autosomal recessive (AR) forms of EDS are seen in which both parents are carriers of a gene mutation and have a 25% chance with each pregnancy to be affected, inheriting both copies of the mutation.   We discussed that clinical testing is available for causative genes for some types of Ehlers-Danlos syndrome. However, genetic testing does not identify a causative mutation in all individuals with a clinical diagnosis. We discussed that the specific detection rate varies with the type of Ehlers-Danlos syndrome. For type III EDS, causative mutations have been identified in the TNXB gene for a small subset of individuals. However, most individuals with a clinical diagnosis of hypermobility type EDS do not have an identified mutation in the TNXB gene. Thus, type III EDS is based on clinical diagnosis.   We discussed that in the case of an identified genetic cause for EDS in a family, prenatal diagnosis via amniocentesis would be available in a pregnancy at  risk to inherit EDS. Ms. Ranay Mccommons understands that prior to pursuing prenatal diagnosis, molecular testing would first need to be performed in an affected relative and identify a causative mutation.  Thus, given that molecular testing for type III EDS is uninformative for the majority of individuals with a clinical diagnosis, prenatal diagnosis via amniocentesis would not be informative regarding EDS in a pregnancy. Targeted ultrasound was performed at the time of today's visit. However, prenatal ultrasound typically would not assess for type III EDS in pregnancy, and the patient understands that ultrasound cannot diagnose or rule out all birth defects or genetic conditions. Complete ultrasound results reported separately. We discussed that postnatal medical genetics evaluation would available for her child(ren) to assess for features of EDS given the 50% recurrence risk for each offspring. Additional information regarding Ms. Keel' diagnosis or the type of EDS in the family may alter this recurrence risk assessment. We also discussed that medical genetics evaluation is available for Ms. Jodi Dennis given that she has not had recent follow-up regarding EDS. We are able to facilitate this referral, if desired. Ms. Booe declined medical genetics evaluation for herself at this time.   Additionally, the patient reported a paternal uncle with Asperger syndrome. Asperger syndrome is a part of autism spectrum disorders (ASD).  We discussed that ASDs are among the most common neurodevelopmental disorders, with approximately 1 in 88 children meeting criteria for ASD. Approximately 80% of individuals diagnosed are female. There  is strong evidence that genetic factors play a critical role in development of ASD. There have been recent advances in identifying specific genetic causes of ASD, however, there are still many individuals for whom the etiology of the ASD is not known. Once a family has a child with a  diagnosis of ASD, there is a 13.5% chance to have another child with ASD. Given the degree of relation (3rd degree relative) and assuming multifactorial inheritance, recurrence risk for the current pregnancy would likely be similar to the general population risk. However, recurrence risk data are limited for ASD in extended relatives. They understand that in the absence of a identified genetic etiology for this relative, prenatal screening or testing for ASD is not available.   The father of the pregnancy reportedly has eczema and asthma. Eczema has been associated with asthma and allergies. Though the underlying cause is currently not known, eczema has been suggested to follow autosomal dominant inheritance with variable expressivity, in some cases. This means, in some families, an individual with eczema has a 1 in 2 (50%) chance of passing on the gene change that could lead to eczema in offspring, though not all who inherit the gene change would develop the same symptoms. Eczema can also be seen as part of an underlying genetic syndrome. Given this, the chance for eczema, asthma, and/or allergies in the current pregnancy could be up to 50%. We discussed that prenatal screening or testing for eczema, asthma, or allergies is not available in the absence of a known genetic cause. It would be helpful for the patient to inform her pediatrician of this history so that her child(ren) can be screened and followed appropriately. The patient was not familiar with the father of the baby's paternal family history.  We, therefore, cannot comment on how his history might contribute to the overall chance for the baby to have a birth defect. Without further information regarding the provided family history, an accurate genetic risk cannot be calculated. Further genetic counseling is warranted if more information is obtained.  Ms. Leylani Gargis was provided with written information regarding sickle cell anemia (SCA) including  the carrier frequency and incidence in the Hispanic and African-American population, the availability of carrier testing and prenatal diagnosis if indicated.  In addition, we discussed that hemoglobinopathies are routinely screened for as part of the [place] newborn screening panel.  Hemoglobin electrophoresis was previously performed through her OB office and indicated the presence of normal adult hemoglobin (AA).  Ms. Netanya Sorrenti denied exposure to environmental toxins or chemical agents. She denied the use of alcohol, tobacco or street drugs. She denied significant viral illnesses during the course of her pregnancy. Her medical and surgical histories were contributory for Ehlers-Danlos syndrome as previously discussed and history of febrile seizures in childhood. Please see separate MFM consult note for discussion of pregnancy management regarding the patient's history of Ehlers-Danlos syndrome and febrile seizures.    I counseled Ms. Jodi Dennis regarding the above risks and available options.  The approximate face-to-face time with the genetic counselor was 35 minutes.  Quinn Plowman, MS,  Certified Genetic Counselor  03/26/2012

## 2012-04-17 ENCOUNTER — Other Ambulatory Visit (HOSPITAL_COMMUNITY): Payer: Self-pay | Admitting: Obstetrics and Gynecology

## 2012-04-17 ENCOUNTER — Ambulatory Visit (HOSPITAL_COMMUNITY)
Admission: RE | Admit: 2012-04-17 | Discharge: 2012-04-17 | Disposition: A | Discharge status: Home / Self Care | Payer: Medicaid Other | Source: Ambulatory Visit | Attending: Obstetrics and Gynecology | Admitting: Obstetrics and Gynecology

## 2012-04-17 DIAGNOSIS — Z3689 Encounter for other specified antenatal screening: Secondary | ICD-10-CM | POA: Insufficient documentation

## 2012-04-17 DIAGNOSIS — Q7962 Hypermobile Ehlers-Danlos syndrome: Secondary | ICD-10-CM

## 2012-04-17 DIAGNOSIS — Q796 Ehlers-Danlos syndrome, unspecified: Secondary | ICD-10-CM | POA: Insufficient documentation

## 2012-04-17 DIAGNOSIS — O99891 Other specified diseases and conditions complicating pregnancy: Secondary | ICD-10-CM | POA: Insufficient documentation

## 2012-06-19 ENCOUNTER — Other Ambulatory Visit (HOSPITAL_COMMUNITY): Payer: Self-pay | Admitting: Obstetrics and Gynecology

## 2012-06-19 DIAGNOSIS — Q796 Ehlers-Danlos syndrome, unspecified: Secondary | ICD-10-CM

## 2012-06-21 ENCOUNTER — Ambulatory Visit (HOSPITAL_COMMUNITY): Payer: Medicaid Other | Attending: Internal Medicine

## 2012-06-21 DIAGNOSIS — G40909 Epilepsy, unspecified, not intractable, without status epilepticus: Secondary | ICD-10-CM | POA: Insufficient documentation

## 2012-06-21 DIAGNOSIS — O99891 Other specified diseases and conditions complicating pregnancy: Secondary | ICD-10-CM | POA: Insufficient documentation

## 2012-06-21 DIAGNOSIS — I379 Nonrheumatic pulmonary valve disorder, unspecified: Secondary | ICD-10-CM | POA: Insufficient documentation

## 2012-06-21 DIAGNOSIS — Q796 Ehlers-Danlos syndrome, unspecified: Secondary | ICD-10-CM | POA: Insufficient documentation

## 2012-06-21 NOTE — Progress Notes (Signed)
Echocardiogram performed.  

## 2012-06-22 ENCOUNTER — Encounter (HOSPITAL_COMMUNITY): Payer: Self-pay | Admitting: Obstetrics and Gynecology

## 2012-07-30 ENCOUNTER — Inpatient Hospital Stay (HOSPITAL_COMMUNITY)
Admission: AD | Admit: 2012-07-30 | Discharge: 2012-07-30 | Disposition: A | Discharge status: Home / Self Care | Payer: Medicaid Other | Source: Ambulatory Visit | Attending: Obstetrics and Gynecology | Admitting: Obstetrics and Gynecology

## 2012-07-30 ENCOUNTER — Encounter (HOSPITAL_COMMUNITY): Payer: Self-pay

## 2012-07-30 DIAGNOSIS — O479 False labor, unspecified: Secondary | ICD-10-CM | POA: Insufficient documentation

## 2012-07-30 LAB — OB RESULTS CONSOLE GBS: GBS: NEGATIVE

## 2012-07-30 NOTE — Progress Notes (Signed)
Admission nutrition screen triggered. Patients chart reviewed and assessed  for nutritional risk.PNR shows weekly steady weight gain during the pregnancy, for a total weight gain of 33 Lbs. Patient is determined to be at low nutrition  risk.  Jodi Dennis M.Odis Luster LDN Neonatal Nutrition Support Specialist Pager [phone]

## 2012-07-30 NOTE — Progress Notes (Signed)
FHT from this am reviewed.  Reactive NST, reg ctx but cervix did not change.

## 2012-07-30 NOTE — MAU Note (Signed)
Contractions every 3-5 minutes. Denies leaking of fluid or vaginal bleeding. Cervix was closed 2 weeks ago in office.

## 2012-08-15 ENCOUNTER — Encounter (HOSPITAL_COMMUNITY): Payer: Self-pay | Admitting: *Deleted

## 2012-08-15 ENCOUNTER — Telehealth (HOSPITAL_COMMUNITY): Payer: Self-pay | Admitting: *Deleted

## 2012-08-15 NOTE — Telephone Encounter (Signed)
Preadmission screen  

## 2012-08-17 ENCOUNTER — Encounter (HOSPITAL_COMMUNITY): Payer: Self-pay | Admitting: *Deleted

## 2012-08-17 ENCOUNTER — Encounter (HOSPITAL_COMMUNITY): Payer: Self-pay | Admitting: General Practice

## 2012-08-17 ENCOUNTER — Other Ambulatory Visit (HOSPITAL_COMMUNITY): Payer: Self-pay | Admitting: General Practice

## 2012-08-17 ENCOUNTER — Inpatient Hospital Stay (HOSPITAL_COMMUNITY)
Admission: AD | Admit: 2012-08-17 | Discharge: 2012-08-17 | Disposition: A | Discharge status: Home / Self Care | Payer: Medicaid Other | Source: Ambulatory Visit | Attending: Obstetrics and Gynecology | Admitting: Obstetrics and Gynecology

## 2012-08-17 ENCOUNTER — Inpatient Hospital Stay (HOSPITAL_COMMUNITY)
Admission: AD | Admit: 2012-08-17 | Discharge: 2012-08-18 | Disposition: A | Discharge status: Home / Self Care | Payer: Medicaid Other | Source: Ambulatory Visit | Attending: Obstetrics and Gynecology | Admitting: Obstetrics and Gynecology

## 2012-08-17 DIAGNOSIS — O479 False labor, unspecified: Secondary | ICD-10-CM | POA: Insufficient documentation

## 2012-08-17 NOTE — MAU Note (Signed)
Type and Crossmatch pending orders are an error in charting. Please disregard.

## 2012-08-17 NOTE — MAU Note (Signed)
Contractions not that often (?every 15 min) but they are really strong when they come.  Pink discharge, nausea. Low back pain.

## 2012-08-17 NOTE — MAU Note (Signed)
Contractions that are really bad. Just left here about 1930. Some bloody show

## 2012-08-18 ENCOUNTER — Inpatient Hospital Stay (HOSPITAL_COMMUNITY): Payer: Medicaid Other | Admitting: Anesthesiology

## 2012-08-18 ENCOUNTER — Inpatient Hospital Stay (HOSPITAL_COMMUNITY)
Admission: AD | Admit: 2012-08-18 | Discharge: 2012-08-20 | DRG: 775 | Disposition: A | Discharge status: Home / Self Care | Payer: Medicaid Other | Source: Ambulatory Visit | Attending: Obstetrics and Gynecology | Admitting: Obstetrics and Gynecology

## 2012-08-18 ENCOUNTER — Encounter (HOSPITAL_COMMUNITY): Payer: Self-pay | Admitting: Anesthesiology

## 2012-08-18 ENCOUNTER — Encounter (HOSPITAL_COMMUNITY): Payer: Self-pay

## 2012-08-18 DIAGNOSIS — O99892 Other specified diseases and conditions complicating childbirth: Secondary | ICD-10-CM | POA: Diagnosis present

## 2012-08-18 DIAGNOSIS — Q7962 Hypermobile Ehlers-Danlos syndrome: Secondary | ICD-10-CM

## 2012-08-18 DIAGNOSIS — Q796 Ehlers-Danlos syndrome, unspecified: Secondary | ICD-10-CM | POA: Diagnosis present

## 2012-08-18 LAB — CBC
HCT: 33.6 % — ABNORMAL LOW (ref 36.0–46.0)
Hemoglobin: 11.1 g/dL — ABNORMAL LOW (ref 12.0–15.0)
MCH: 22.8 pg — ABNORMAL LOW (ref 26.0–34.0)
MCHC: 33 g/dL (ref 30.0–36.0)
MCV: 69 fL — ABNORMAL LOW (ref 78.0–100.0)
Platelets: 257 10*3/uL (ref 150–400)
RBC: 4.87 MIL/uL (ref 3.87–5.11)
RDW: 16.5 % — ABNORMAL HIGH (ref 11.5–15.5)
WBC: 16.5 10*3/uL — ABNORMAL HIGH (ref 4.0–10.5)

## 2012-08-18 LAB — RPR: RPR Ser Ql: NONREACTIVE

## 2012-08-18 LAB — TYPE AND SCREEN
ABO/RH(D): O POS
Antibody Screen: NEGATIVE

## 2012-08-18 LAB — ABO/RH: ABO/RH(D): O POS

## 2012-08-18 MED ORDER — ONDANSETRON HCL 4 MG/2ML IJ SOLN: 4.0000 mg | Freq: Four times a day (QID) | INTRAMUSCULAR | Status: DC | PRN

## 2012-08-18 MED ORDER — LACTATED RINGERS IV SOLN
INTRAVENOUS | Status: DC
  Administered 2012-08-18: 16:00:00 via INTRAVENOUS

## 2012-08-18 MED ORDER — TERBUTALINE SULFATE 1 MG/ML IJ SOLN: 0.2500 mg | Freq: Once | INTRAMUSCULAR | Status: DC | PRN

## 2012-08-18 MED ORDER — ONDANSETRON HCL 4 MG/2ML IJ SOLN: 4.0000 mg | INTRAMUSCULAR | Status: DC | PRN

## 2012-08-18 MED ORDER — LANOLIN HYDROUS EX OINT: TOPICAL_OINTMENT | CUTANEOUS | Status: DC | PRN

## 2012-08-18 MED ORDER — LACTATED RINGERS IV SOLN: 500.0000 mL | Freq: Once | INTRAVENOUS | Status: DC

## 2012-08-18 MED ORDER — DIPHENHYDRAMINE HCL 50 MG/ML IJ SOLN: 12.5000 mg | INTRAMUSCULAR | Status: DC | PRN

## 2012-08-18 MED ORDER — FLEET ENEMA 7-19 GM/118ML RE ENEM: 1.0000 | ENEMA | RECTAL | Status: DC | PRN

## 2012-08-18 MED ORDER — OXYCODONE-ACETAMINOPHEN 5-325 MG PO TABS: 1.0000 | ORAL_TABLET | ORAL | Status: DC | PRN

## 2012-08-18 MED ORDER — OXYTOCIN 40 UNITS IN LACTATED RINGERS INFUSION - SIMPLE MED
1.0000 m[IU]/min | INTRAVENOUS | Status: DC
  Administered 2012-08-18: 1 m[IU]/min via INTRAVENOUS
  Filled 2012-08-18: qty 1000

## 2012-08-18 MED ORDER — LIDOCAINE HCL (PF) 1 % IJ SOLN
INTRAMUSCULAR | Status: DC | PRN
  Administered 2012-08-18 (×2): 8 mL

## 2012-08-18 MED ORDER — IBUPROFEN 600 MG PO TABS
600.0000 mg | ORAL_TABLET | Freq: Four times a day (QID) | ORAL | Status: DC | PRN
  Administered 2012-08-18: 600 mg via ORAL
  Filled 2012-08-18: qty 1

## 2012-08-18 MED ORDER — OXYCODONE-ACETAMINOPHEN 5-325 MG PO TABS
1.0000 | ORAL_TABLET | Freq: Once | ORAL | Status: AC
  Administered 2012-08-18: 1 via ORAL
  Filled 2012-08-18: qty 1

## 2012-08-18 MED ORDER — ZOLPIDEM TARTRATE 5 MG PO TABS: 5.0000 mg | ORAL_TABLET | Freq: Every evening | ORAL | Status: DC | PRN

## 2012-08-18 MED ORDER — ACETAMINOPHEN 325 MG PO TABS: 650.0000 mg | ORAL_TABLET | ORAL | Status: DC | PRN

## 2012-08-18 MED ORDER — ONDANSETRON HCL 4 MG PO TABS: 4.0000 mg | ORAL_TABLET | ORAL | Status: DC | PRN

## 2012-08-18 MED ORDER — DIPHENHYDRAMINE HCL 25 MG PO CAPS: 25.0000 mg | ORAL_CAPSULE | Freq: Four times a day (QID) | ORAL | Status: DC | PRN

## 2012-08-18 MED ORDER — DIBUCAINE 1 % RE OINT: 1.0000 "application " | TOPICAL_OINTMENT | RECTAL | Status: DC | PRN

## 2012-08-18 MED ORDER — OXYTOCIN 40 UNITS IN LACTATED RINGERS INFUSION - SIMPLE MED
62.5000 mL/h | INTRAVENOUS | Status: DC
Start: 2012-08-18 — End: 2012-08-18
  Administered 2012-08-18: 62.5 mL/h via INTRAVENOUS

## 2012-08-18 MED ORDER — CITRIC ACID-SODIUM CITRATE 334-500 MG/5ML PO SOLN: 30.0000 mL | ORAL | Status: DC | PRN

## 2012-08-18 MED ORDER — SIMETHICONE 80 MG PO CHEW: 80.0000 mg | CHEWABLE_TABLET | ORAL | Status: DC | PRN

## 2012-08-18 MED ORDER — PHENYLEPHRINE 40 MCG/ML (10ML) SYRINGE FOR IV PUSH (FOR BLOOD PRESSURE SUPPORT)
80.0000 ug | PREFILLED_SYRINGE | INTRAVENOUS | Status: DC | PRN
  Filled 2012-08-18: qty 2

## 2012-08-18 MED ORDER — IBUPROFEN 600 MG PO TABS
600.0000 mg | ORAL_TABLET | Freq: Four times a day (QID) | ORAL | Status: DC | PRN
  Administered 2012-08-19 – 2012-08-20 (×5): 600 mg via ORAL
  Filled 2012-08-18 (×5): qty 1

## 2012-08-18 MED ORDER — EPHEDRINE 5 MG/ML INJ: 10.0000 mg | INTRAVENOUS | Status: DC | PRN

## 2012-08-18 MED ORDER — LACTATED RINGERS IV SOLN: INTRAVENOUS | Status: AC

## 2012-08-18 MED ORDER — OXYTOCIN BOLUS FROM INFUSION: 500.0000 mL | INTRAVENOUS | Status: DC

## 2012-08-18 MED ORDER — OXYTOCIN 40 UNITS IN LACTATED RINGERS INFUSION - SIMPLE MED: 62.5000 mL/h | INTRAVENOUS | Status: AC | PRN

## 2012-08-18 MED ORDER — MEASLES, MUMPS & RUBELLA VAC ~~LOC~~ INJ
0.5000 mL | INJECTION | Freq: Once | SUBCUTANEOUS | Status: DC
  Filled 2012-08-18: qty 0.5

## 2012-08-18 MED ORDER — PHENYLEPHRINE 40 MCG/ML (10ML) SYRINGE FOR IV PUSH (FOR BLOOD PRESSURE SUPPORT)
80.0000 ug | PREFILLED_SYRINGE | INTRAVENOUS | Status: DC | PRN
  Filled 2012-08-18: qty 2
  Filled 2012-08-18: qty 5

## 2012-08-18 MED ORDER — FENTANYL 2.5 MCG/ML BUPIVACAINE 1/10 % EPIDURAL INFUSION (WH - ANES)
14.0000 mL/h | INTRAMUSCULAR | Status: DC | PRN
Start: 2012-08-18 — End: 2012-08-18

## 2012-08-18 MED ORDER — TETANUS-DIPHTH-ACELL PERTUSSIS 5-2.5-18.5 LF-MCG/0.5 IM SUSP: 0.5000 mL | Freq: Once | INTRAMUSCULAR | Status: DC

## 2012-08-18 MED ORDER — LIDOCAINE HCL (PF) 1 % IJ SOLN
30.0000 mL | INTRAMUSCULAR | Status: DC | PRN
  Administered 2012-08-18: 30 mL via SUBCUTANEOUS
  Filled 2012-08-18 (×2): qty 30

## 2012-08-18 MED ORDER — WITCH HAZEL-GLYCERIN EX PADS: 1.0000 "application " | MEDICATED_PAD | CUTANEOUS | Status: DC | PRN

## 2012-08-18 MED ORDER — EPHEDRINE 5 MG/ML INJ
10.0000 mg | INTRAVENOUS | Status: DC | PRN
  Filled 2012-08-18: qty 2
  Filled 2012-08-18: qty 4

## 2012-08-18 MED ORDER — SENNOSIDES-DOCUSATE SODIUM 8.6-50 MG PO TABS
2.0000 | ORAL_TABLET | Freq: Every day | ORAL | Status: DC
  Administered 2012-08-19: 2 via ORAL

## 2012-08-18 MED ORDER — EPHEDRINE 5 MG/ML INJ
10.0000 mg | INTRAVENOUS | Status: DC | PRN
  Filled 2012-08-18: qty 2

## 2012-08-18 MED ORDER — FENTANYL 2.5 MCG/ML BUPIVACAINE 1/10 % EPIDURAL INFUSION (WH - ANES)
14.0000 mL/h | INTRAMUSCULAR | Status: DC | PRN
  Filled 2012-08-18: qty 125

## 2012-08-18 MED ORDER — PRENATAL MULTIVITAMIN CH
1.0000 | ORAL_TABLET | Freq: Every day | ORAL | Status: DC
Start: 2012-08-19 — End: 2012-08-20
  Administered 2012-08-19: 1 via ORAL
  Filled 2012-08-18: qty 1

## 2012-08-18 MED ORDER — BENZOCAINE-MENTHOL 20-0.5 % EX AERO
1.0000 "application " | INHALATION_SPRAY | CUTANEOUS | Status: DC | PRN
  Filled 2012-08-18: qty 56

## 2012-08-18 MED ORDER — PHENYLEPHRINE 40 MCG/ML (10ML) SYRINGE FOR IV PUSH (FOR BLOOD PRESSURE SUPPORT): 80.0000 ug | PREFILLED_SYRINGE | INTRAVENOUS | Status: DC | PRN

## 2012-08-18 MED ORDER — LACTATED RINGERS IV SOLN
500.0000 mL | INTRAVENOUS | Status: DC | PRN
  Administered 2012-08-18: 500 mL via INTRAVENOUS

## 2012-08-18 MED ORDER — FENTANYL 2.5 MCG/ML BUPIVACAINE 1/10 % EPIDURAL INFUSION (WH - ANES)
INTRAMUSCULAR | Status: DC | PRN
  Administered 2012-08-18: 14 mL/h via EPIDURAL

## 2012-08-18 NOTE — Progress Notes (Signed)
Patient ID: Jodi Dennis, female   DOB: Mar 05, 1991, 22 y.o.   MRN: [id number] Pt is contracting q 6-7 minutes. The cervix is 7 cm 100% effaced and the vertex is at 0 station. I felt membranes but with amniotomy I did not see any fluid. Will start pitocin

## 2012-08-18 NOTE — H&P (Signed)
NAMEARANTZA, DARRINGTON NO.:  [id number]  MEDICAL RECORD NO.:  [id number]  LOCATION:  9173                          FACILITY:  [facility]  PHYSICIAN:  Malachi Pro. Ambrose Mantle, M.D. DATE OF BIRTH:  09-20-1990  DATE OF ADMISSION:  08/18/2012 DATE OF DISCHARGE:                             HISTORY & PHYSICAL   PRESENT ILLNESS:  This is a 22 year old black female, para 0, gravida 1, EDC August 20, 2012, admitted in labor at 7 cm dilatation per the maternity admission unit RN.  Blood group and type O positive.  Negative antibody.  Pap smear normal.  Rubella immune.  RPR nonreactive.  Urine culture negative.  Hepatitis B surface antigen negative.  HIV negative. GC and Chlamydia negative.  Hemoglobin AA first trimester screen too advanced.  Cystic fibrosis screen negative.  Quad screen negative.  One- hour Glucola 117.  Group B strep negative.  The patient began her prenatal course in our office at 15-3/7th weeks gestation.  She had a relatively uncomplicated prenatal course.  She has a history of Ehlers- Danlos syndrome type 3.  Baby has a 60% chance of inheriting it.  The patient had come to the emergency room on 2 occasions on August 17, 2012 thinking she was in labor and her cervix remained unchanged at 1 to 2 cm.  She continued to contract at home, came back to the emergency room today and was found to be 7 cm dilated by the admitting nurse.  PAST MEDICAL HISTORY:  Revealed breast mass in 2011 that was benign by ultrasound, Ehlers-Danlos syndrome causing weaker joints, hyperelastic skin and bruised easily, history of epilepsy until age 38.  SURGICAL HISTORY:  None.  MEDICATIONS:  Prenatal vitamins.  ALLERGIES:  No latex allergy.  She was allergic to PENICILLIN as a baby.  FAMILY MEDICAL HISTORY:  Ehlers-Danlos syndrome in an aunt and father. Kidney disease in a sister at age 22, one kidney removed at age 47.  SOCIAL HISTORY:  The patient claims to be active without a  formal exercise program.  She drank socially.  Never used drugs.  Never used tobacco.  PHYSICAL EXAMINATION:  GENERAL:  On admission revealed a well-developed, well-nourished black female. VITAL SIGNS:  Temperature of 99.8, pulse 119, respirations 18, blood pressure 114/88. HEART:  Normal size and sounds, but rate of 119.  No murmurs. LUNGS:  Clear to auscultation. ABDOMEN:  Soft.  Fundal height at the last reported prenatal visit at 37 weeks and 1 day was 36 cm.  Fetal heart tones are normal.  There is a borderline fetal tachycardia per the RN.  The cervix is 7 cm and 100% vertex at a 0 station.  Patient at the present time is requesting an epidural.  I am uncertain whether the patient may have early chorioamnionitis.  I will watch her temperature closely and try to speed the process of labor to delivery.     Malachi Pro. Ambrose Mantle, M.D.     TFH/MEDQ  D:  08/18/2012  T:  08/18/2012  Job:  [id number]

## 2012-08-18 NOTE — Progress Notes (Signed)
Patient ID: Jodi Dennis, female   DOB: March 15, 1991, 22 y.o.   MRN: [id number] Delivery note.   The pt progressed to full dilatation and pushed well. She delivered a living female infant spontaneously ROA over an intact perineum Apgars were 9 and 9 at 1 and 5 minutes. The placenta delivered intact and the uterus was normal The baby smelled foul at delivery so a culture was obtained from the fetal and maternal surfaces of the placenta. There  were right periurethral and left lower vaginal lacerations that were actively bleeding and repaired with 3-0 vicryl.EBL 400 cc's

## 2012-08-18 NOTE — MAU Note (Signed)
Contractions nonstop since leaving last night thinks leaking fluid.

## 2012-08-18 NOTE — Anesthesia Preprocedure Evaluation (Signed)
Anesthesia Evaluation  Patient identified by MRN, date of birth, ID band Patient awake    Reviewed: Allergy & Precautions, H&P , NPO status , Patient's Chart, lab work & pertinent test results  Airway Mallampati: II TM Distance: >3 FB Neck ROM: full    Dental no notable dental hx.    Pulmonary neg pulmonary ROS,    Pulmonary exam normal       Cardiovascular negative cardio ROS      Neuro/Psych negative psych ROS   GI/Hepatic negative GI ROS, Neg liver ROS,   Endo/Other  negative endocrine ROS  Renal/GU negative Renal ROS  negative genitourinary   Musculoskeletal negative musculoskeletal ROS (+)   Abdominal Normal abdominal exam  (+)   Peds negative pediatric ROS (+)  Hematology negative hematology ROS (+)   Anesthesia Other Findings   Reproductive/Obstetrics (+) Pregnancy                           Anesthesia Physical Anesthesia Plan  ASA: II  Anesthesia Plan: Epidural   Post-op Pain Management:    Induction:   Airway Management Planned:   Additional Equipment:   Intra-op Plan:   Post-operative Plan:   Informed Consent: I have reviewed the patients History and Physical, chart, labs and discussed the procedure including the risks, benefits and alternatives for the proposed anesthesia with the patient or authorized representative who has indicated his/her understanding and acceptance.     Plan Discussed with:   Anesthesia Plan Comments:         Anesthesia Quick Evaluation  

## 2012-08-18 NOTE — Anesthesia Procedure Notes (Signed)
Epidural Patient location during procedure: OB Start time: 08/18/2012 4:50 PM End time: 08/18/2012 4:54 PM  Staffing Anesthesiologist: Sandrea Hughs Performed by: anesthesiologist   Preanesthetic Checklist Completed: patient identified, site marked, surgical consent, pre-op evaluation, timeout performed, IV checked, risks and benefits discussed and monitors and equipment checked  Epidural Patient position: sitting Prep: site prepped and draped and DuraPrep Patient monitoring: continuous pulse ox and blood pressure Approach: midline Injection technique: LOR air  Needle:  Needle type: Tuohy  Needle gauge: 17 G Needle length: 9 cm and 9 Needle insertion depth: 5 cm cm Catheter type: closed end flexible Catheter size: 19 Gauge Catheter at skin depth: 10 cm Test dose: negative and Other  Assessment Sensory level: T9 Events: blood not aspirated, injection not painful, no injection resistance, negative IV test and no paresthesia  Additional Notes Reason for block:procedure for pain

## 2012-08-19 LAB — CBC
HCT: 29.8 % — ABNORMAL LOW (ref 36.0–46.0)
Hemoglobin: 9.9 g/dL — ABNORMAL LOW (ref 12.0–15.0)
MCH: 23.1 pg — ABNORMAL LOW (ref 26.0–34.0)
MCHC: 33.2 g/dL (ref 30.0–36.0)
MCV: 69.5 fL — ABNORMAL LOW (ref 78.0–100.0)
Platelets: 227 10*3/uL (ref 150–400)
RBC: 4.29 MIL/uL (ref 3.87–5.11)
RDW: 16.7 % — ABNORMAL HIGH (ref 11.5–15.5)
WBC: 16.5 10*3/uL — ABNORMAL HIGH (ref 4.0–10.5)

## 2012-08-19 NOTE — Progress Notes (Signed)
Patient ID: Jodi Dennis, female   DOB: [birth year], 22 y.o.   MRN: [id number] #1 afebrile BP normal no problems

## 2012-08-19 NOTE — Anesthesia Postprocedure Evaluation (Signed)
Anesthesia Post Note  Patient: Jodi Dennis  Procedure(s) Performed: * No procedures listed *  Anesthesia type: Epidural  Patient location: Mother/Baby  Post pain: Pain level controlled  Post assessment: Post-op Vital signs reviewed  Last Vitals: BP 97/64  Pulse 100  Temp(Src) 37.2 C (Oral)  Resp 18  Ht 5\' 2"  (1.575 m)  Wt 159 lb (72.122 kg)  BMI 29.07 kg/m2  SpO2 94%  LMP 11/07/2011  Post vital signs: Reviewed  Level of consciousness: awake  Complications: No apparent anesthesia complications

## 2012-08-20 MED ORDER — GNP PRENATAL VITAMINS 28-0.8 MG PO TABS: 1.0000 | ORAL_TABLET | Freq: Every morning | ORAL | Status: DC

## 2012-08-20 MED ORDER — PRENATAL MULTIVITAMIN CH: 1.0000 | ORAL_TABLET | Freq: Every day | ORAL | Status: AC

## 2012-08-20 NOTE — Progress Notes (Signed)
Patient ID: Jodi Dennis, female   DOB: 1991/02/05, 22 y.o.   MRN: [id number] #2 afebrile BP normal no problems for d/c

## 2012-08-20 NOTE — Progress Notes (Signed)
UR chart review completed.  

## 2012-08-21 ENCOUNTER — Inpatient Hospital Stay (HOSPITAL_COMMUNITY): Admission: RE | Admit: 2012-08-21 | Payer: Medicaid Other | Source: Ambulatory Visit

## 2012-08-21 LAB — WOUND CULTURE: Culture: NO GROWTH

## 2012-08-21 NOTE — Discharge Summary (Signed)
NAMEKIMBELY, WHITEAKER NO.:  [id number]  MEDICAL RECORD NO.:  [id number]  LOCATION:  9106                          [facility]ACILITY:  [facility]  PHYSICIAN:  Malachi Pro. Ambrose Mantle, M.D. DATE OF BIRTH:  1990-06-30  DATE OF ADMISSION:  08/18/2012 DATE OF DISCHARGE:  08/20/2012                              DISCHARGE SUMMARY   A 22 year old black female, para 0, gravida 1, EDC August 20, 2012, admitted in Labor at 7 cm dilatation.  Blood group and type O positive. Negative antibody.  Pap smear normal.  Rubella immune.  RPR nonreactive. Urine culture negative.  Hepatitis B surface antigen negative, HIV negative.  GC and Chlamydia negative.  Hemoglobin AA first trimester screen to advanced cystic fibrosis negative.  Quad screen negative.  One hour Glucola 117.  Group B strep negative.  The patient had a history of Ehlers-Danlos syndrome type 3.  She had otherwise uncomplicated prenatal course.  She had came to the hospital twice in the previous 24 hours thinking she might be at labor.  She was sent home and came back into the hospital at 7 cm dilated.  Her contractions were only every 6-7 minutes.  Started Pitocin.  The patient then began a regular labor pattern, progressed to full dilatation and pushed well.  She delivered a living female infant spontaneously, ROA over an intact perineum by Dr. Ambrose Mantle.  Apgars were 9 and 9 at 1 and 5 minutes.  Weight was 6 pounds and 14 ounces.  Placenta delivered intact.  Uterus was normal.  The baby smelled foul at delivery, so a culture was obtained from the fetal and maternal surface of the placenta.  There were right periurethral and left lower vaginal lacerations that were actively bleeding and repaired with 3-0 Vicryl.  Blood loss about 400 mL.  Postpartum, the patient did well and was discharged on the second postpartum day.  The baby had no problems.  Hemoglobin on admission 11.1, hematocrit 33.6, white count 16,500, platelet count 257,000.  RPR was  nonreactive.  Followup hemoglobin 9.9.  FINAL DIAGNOSIS:  Intrauterine pregnancy at term, delivered ROA.  OPERATION:  Spontaneous delivery, ROA, repair of lacerations in the periurethral area and lower left vaginal area.  FINAL CONDITION:  Improved.  INSTRUCTIONS:  Include our regular discharge instruction booklet.  Then after visit summary, she is to continue her prenatal vitamins.  She is also asked to take ferrous sulfate 325 mg daily.  Return to the office in 6 weeks and she plans circumcise the baby in the office.  I have instructed her that I will do the circumcision after the baby is 71-week-old.  She declines analgesics at discharge.     Malachi Pro. Ambrose Mantle, M.D.     TFH/MEDQ  D:  08/20/2012  T:  08/21/2012  Job:  [id number]

## 2014-03-24 ENCOUNTER — Encounter (HOSPITAL_COMMUNITY): Payer: Self-pay

## 2014-06-24 IMAGING — US US OB COMP LESS 14 WK
1 series · 14 of 22 positions shown · non-contrast
Comparison: None.

CLINICAL DATA: Pregnant, bleeding

OBSTETRIC <14 WK ULTRASOUND
TECHNIQUE: Transabdominal ultrasound was performed for evaluation
of the gestation as well as the maternal uterus and adnexal
regions.

[Series 1: us ob comp less 14 wk · 0.23mm/px · 14 of 22 slices shown]
[im 1/22]
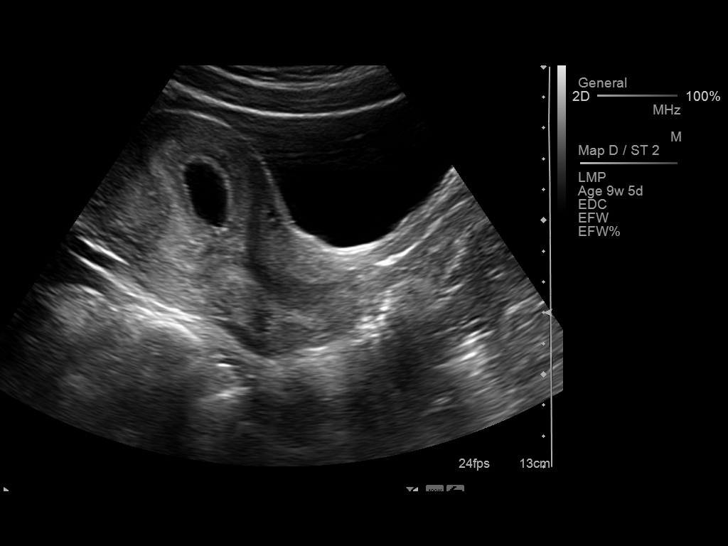
[im 3/22]
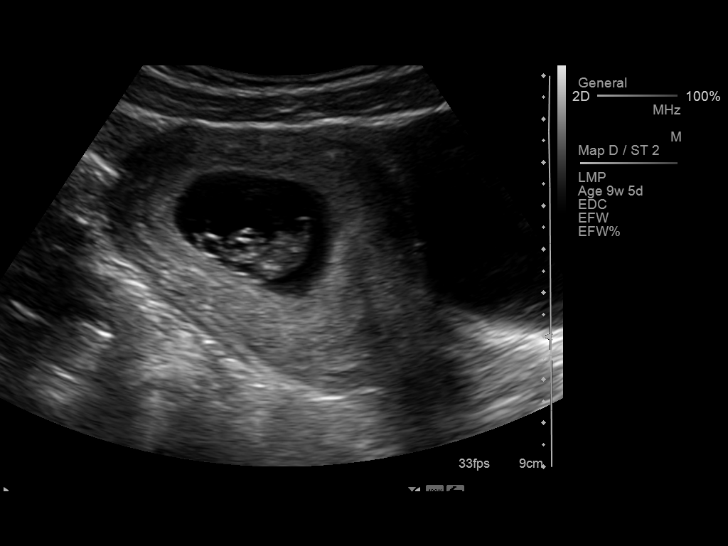
[im 4/22]
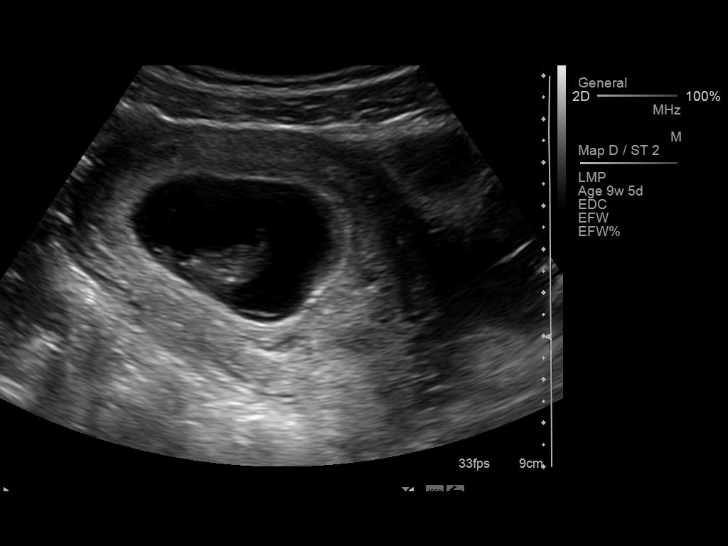
[im 6/22]
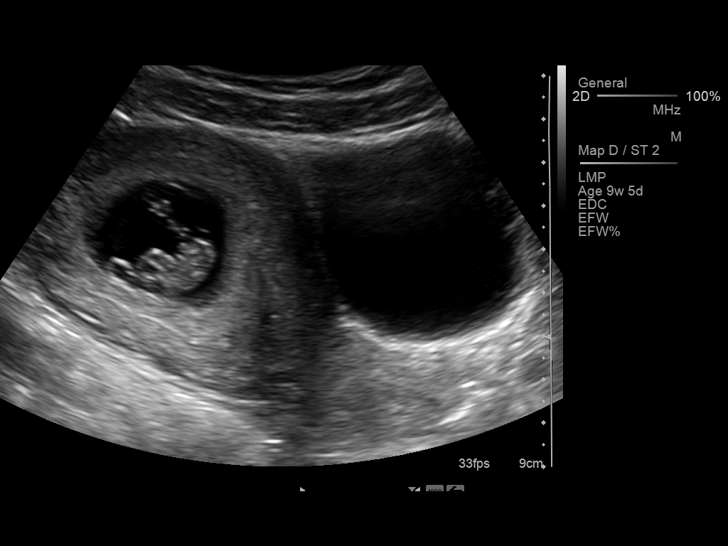
[im 8/22]
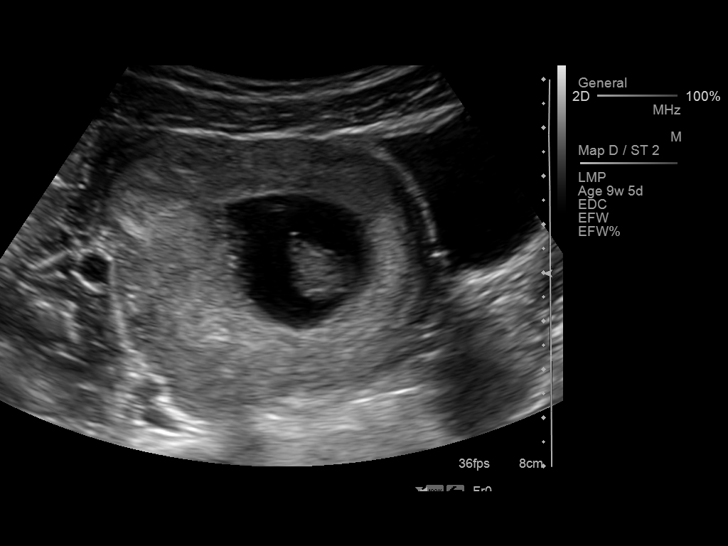
[im 9/22]
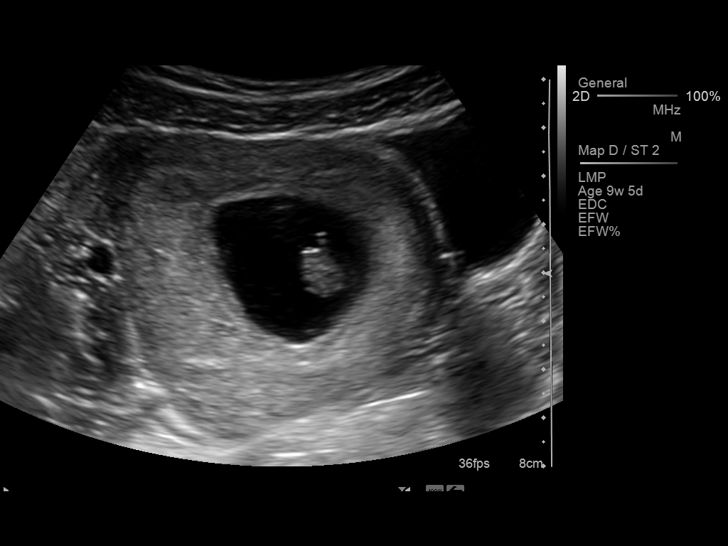
[im 11/22]
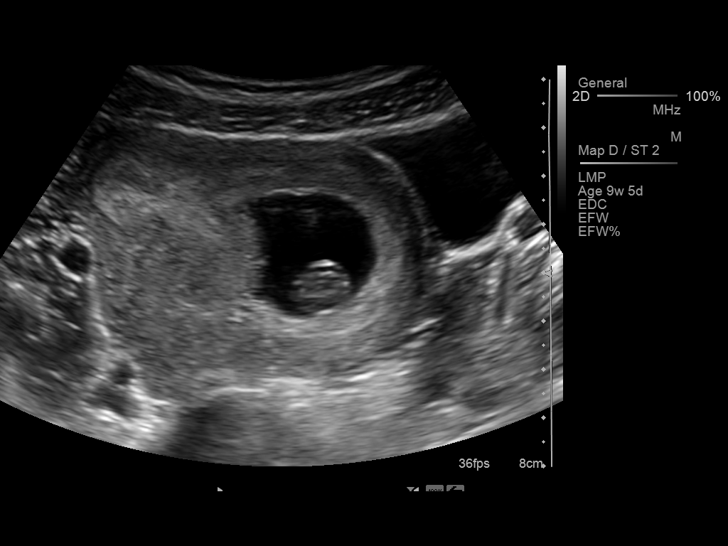
[im 12/22]
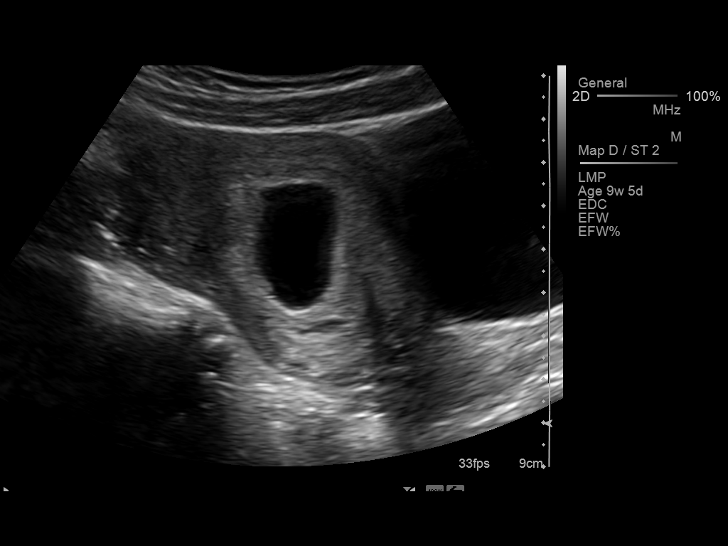
[im 14/22]
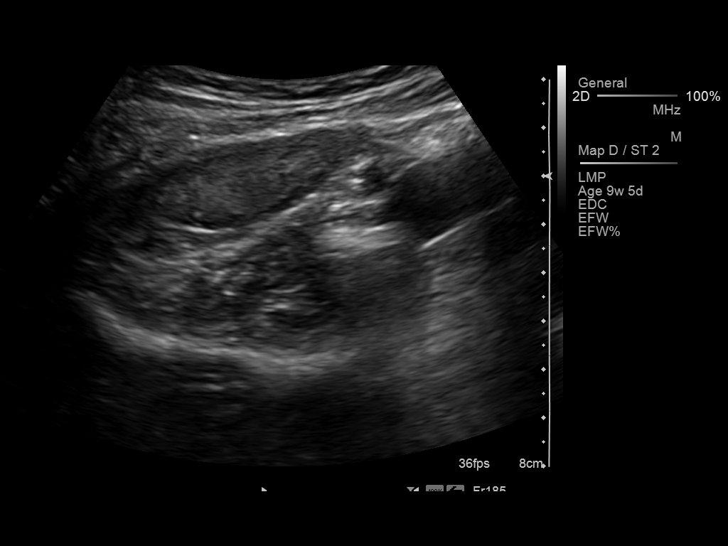
[im 15/22]
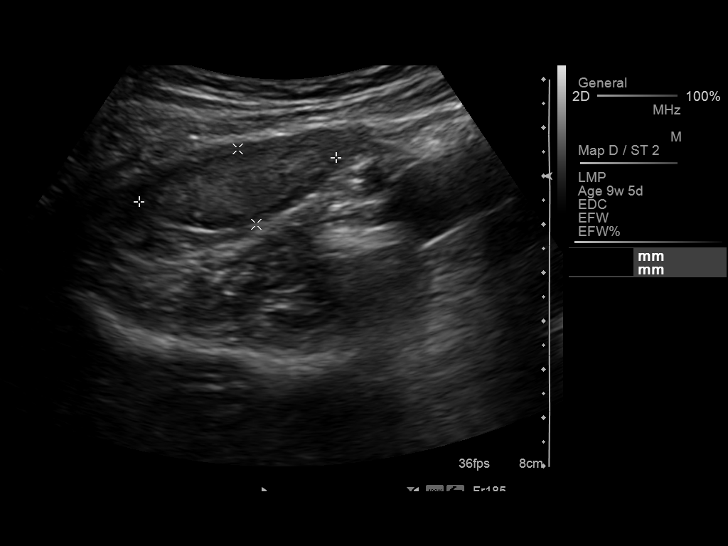
[im 17/22]
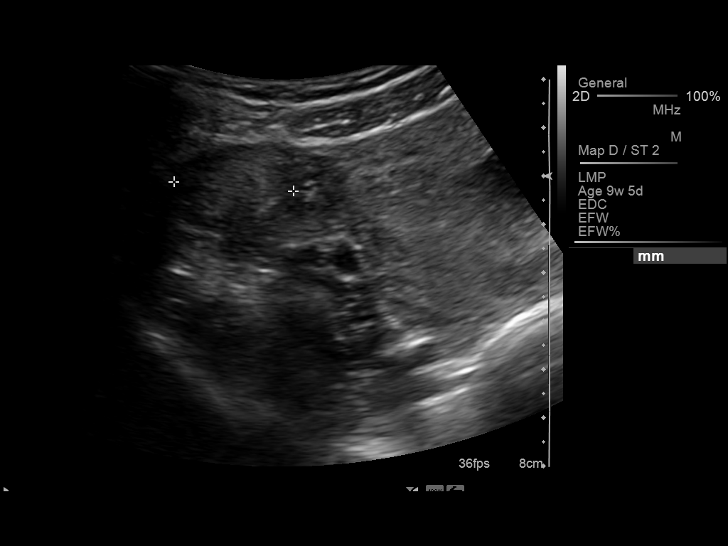
[im 19/22]
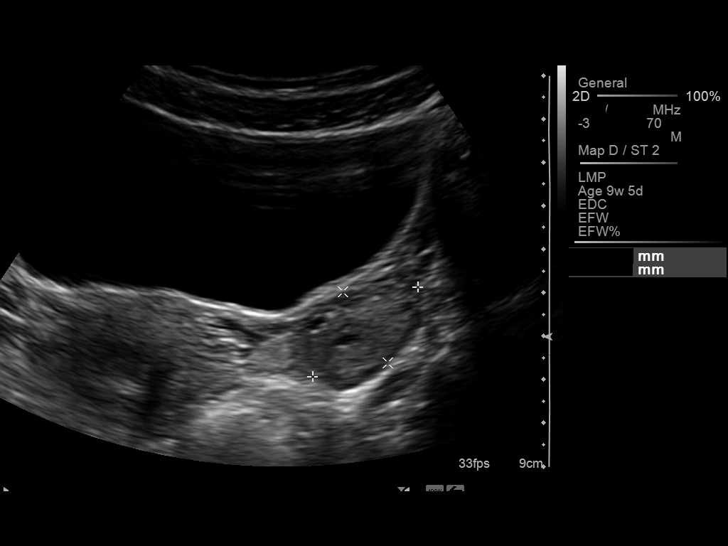
[im 20/22]
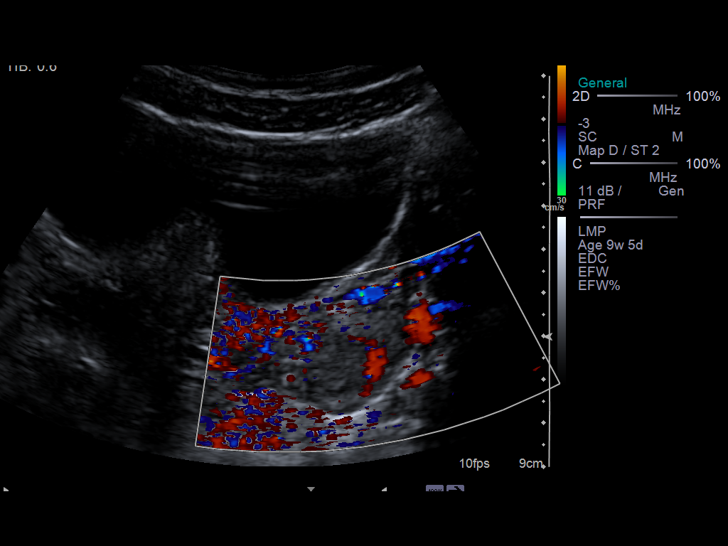
[im 22/22]
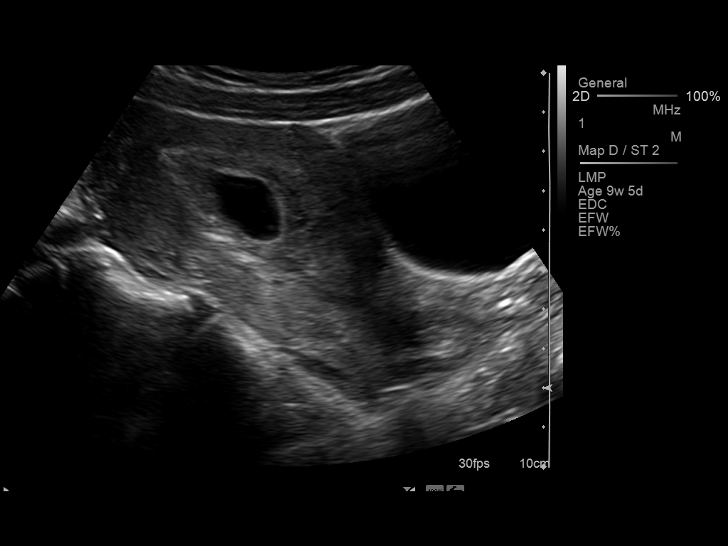

[14 of 22 positions shown; findings below may reference images not displayed]

Intrauterine gestational sac: Visualized/normal in shape.
Yolk sac: Present
Embryo: Present
Cardiac Activity: Present
Heart Rate: 169 bpm

CRL:  23.1 mm  9 w  0 d
US EDC: 08/18/2012

Maternal uterus/Adnexae:
Small subchronic hemorrhage.

Left ovary is within normal limits, measuring 1.9 x 3.2 x 2.8 cm.

Right ovary is within normal limits, measuring 1.6 x 4.2 x 2.5 cm.

No free fluid.
IMPRESSION: Single live intrauterine gestation with estimated gestational age
of 9 weeks 0 days by crown-rump length.

## 2019-11-21 ENCOUNTER — Other Ambulatory Visit: Payer: Self-pay | Admitting: Family Medicine

## 2019-11-21 DIAGNOSIS — M7989 Other specified soft tissue disorders: Secondary | ICD-10-CM

## 2019-12-03 ENCOUNTER — Ambulatory Visit
Admission: RE | Admit: 2019-12-03 | Discharge: 2019-12-03 | Disposition: A | Discharge status: Home / Self Care | Payer: BC Managed Care – PPO | Source: Ambulatory Visit | Attending: Family Medicine | Admitting: Family Medicine

## 2019-12-03 DIAGNOSIS — M7989 Other specified soft tissue disorders: Secondary | ICD-10-CM

## 2020-02-10 LAB — OB RESULTS CONSOLE GC/CHLAMYDIA
Chlamydia: NEGATIVE
Gonorrhea: NEGATIVE

## 2020-02-10 LAB — OB RESULTS CONSOLE HEPATITIS B SURFACE ANTIGEN: Hepatitis B Surface Ag: NEGATIVE

## 2020-02-10 LAB — OB RESULTS CONSOLE ABO/RH: RH Type: POSITIVE

## 2020-02-10 LAB — OB RESULTS CONSOLE RUBELLA ANTIBODY, IGM: Rubella: IMMUNE

## 2020-02-10 LAB — OB RESULTS CONSOLE ANTIBODY SCREEN: Antibody Screen: NEGATIVE

## 2020-02-10 LAB — OB RESULTS CONSOLE RPR: RPR: NONREACTIVE

## 2020-02-10 LAB — OB RESULTS CONSOLE HIV ANTIBODY (ROUTINE TESTING): HIV: NONREACTIVE

## 2020-02-18 ENCOUNTER — Encounter (HOSPITAL_COMMUNITY): Payer: Self-pay | Admitting: Obstetrics and Gynecology

## 2020-02-18 ENCOUNTER — Other Ambulatory Visit: Payer: Self-pay

## 2020-02-18 ENCOUNTER — Inpatient Hospital Stay (HOSPITAL_COMMUNITY)
Admission: AD | Admit: 2020-02-18 | Discharge: 2020-02-18 | Disposition: A | Discharge status: Home / Self Care | Payer: BC Managed Care – PPO | Attending: Obstetrics and Gynecology | Admitting: Obstetrics and Gynecology

## 2020-02-18 DIAGNOSIS — Q796 Ehlers-Danlos syndrome, unspecified: Secondary | ICD-10-CM | POA: Insufficient documentation

## 2020-02-18 DIAGNOSIS — O99351 Diseases of the nervous system complicating pregnancy, first trimester: Secondary | ICD-10-CM | POA: Diagnosis not present

## 2020-02-18 DIAGNOSIS — Z79899 Other long term (current) drug therapy: Secondary | ICD-10-CM | POA: Diagnosis not present

## 2020-02-18 DIAGNOSIS — O218 Other vomiting complicating pregnancy: Secondary | ICD-10-CM | POA: Diagnosis not present

## 2020-02-18 DIAGNOSIS — O219 Vomiting of pregnancy, unspecified: Secondary | ICD-10-CM | POA: Insufficient documentation

## 2020-02-18 DIAGNOSIS — O210 Mild hyperemesis gravidarum: Secondary | ICD-10-CM

## 2020-02-18 DIAGNOSIS — F329 Major depressive disorder, single episode, unspecified: Secondary | ICD-10-CM | POA: Insufficient documentation

## 2020-02-18 DIAGNOSIS — O99341 Other mental disorders complicating pregnancy, first trimester: Secondary | ICD-10-CM | POA: Diagnosis not present

## 2020-02-18 DIAGNOSIS — Z880 Allergy status to penicillin: Secondary | ICD-10-CM | POA: Insufficient documentation

## 2020-02-18 DIAGNOSIS — Z3A11 11 weeks gestation of pregnancy: Secondary | ICD-10-CM | POA: Diagnosis not present

## 2020-02-18 HISTORY — DX: Depression, unspecified: F32.A

## 2020-02-18 LAB — CBC
HCT: 38.8 % (ref 36.0–46.0)
Hemoglobin: 12.9 g/dL (ref 12.0–15.0)
MCH: 26.5 pg (ref 26.0–34.0)
MCHC: 33.2 g/dL (ref 30.0–36.0)
MCV: 79.8 fL — ABNORMAL LOW (ref 80.0–100.0)
Platelets: 303 10*3/uL (ref 150–400)
RBC: 4.86 MIL/uL (ref 3.87–5.11)
RDW: 12.9 % (ref 11.5–15.5)
WBC: 6.8 10*3/uL (ref 4.0–10.5)
nRBC: 0 % (ref 0.0–0.2)

## 2020-02-18 LAB — BASIC METABOLIC PANEL
Anion gap: 8 (ref 5–15)
BUN: 6 mg/dL (ref 6–20)
CO2: 23 mmol/L (ref 22–32)
Calcium: 9.6 mg/dL (ref 8.9–10.3)
Chloride: 102 mmol/L (ref 98–111)
Creatinine, Ser: 0.59 mg/dL (ref 0.44–1.00)
GFR calc Af Amer: >60 mL/min (ref >60)
GFR calc non Af Amer: >60 mL/min (ref >60)
Glucose, Bld: 91 mg/dL (ref 70–99)
Potassium: 3.7 mmol/L (ref 3.5–5.1)
Sodium: 133 mmol/L — ABNORMAL LOW (ref 135–145)

## 2020-02-18 LAB — URINALYSIS, ROUTINE W REFLEX MICROSCOPIC
Bilirubin Urine: NEGATIVE
Glucose, UA: NEGATIVE mg/dL
Hgb urine dipstick: NEGATIVE
Ketones, ur: NEGATIVE mg/dL
Nitrite: NEGATIVE
Protein, ur: 30 mg/dL — AB
Specific Gravity, Urine: 1.026 (ref 1.005–1.030)
pH: 6 (ref 5.0–8.0)

## 2020-02-18 LAB — POCT PREGNANCY, URINE: Preg Test, Ur: POSITIVE — AB

## 2020-02-18 MED ORDER — FAMOTIDINE IN NACL 20-0.9 MG/50ML-% IV SOLN
20.0000 mg | Freq: Once | INTRAVENOUS | Status: AC
  Administered 2020-02-18: 20 mg via INTRAVENOUS
  Filled 2020-02-18: qty 50

## 2020-02-18 MED ORDER — METOCLOPRAMIDE HCL 10 MG PO TABS: 10.0000 mg | ORAL_TABLET | Freq: Four times a day (QID) | ORAL | 0 refills | Status: DC | PRN

## 2020-02-18 MED ORDER — PROMETHAZINE HCL 25 MG/ML IJ SOLN
25.0000 mg | Freq: Four times a day (QID) | INTRAMUSCULAR | Status: DC | PRN
  Administered 2020-02-18: 25 mg via INTRAVENOUS
  Filled 2020-02-18: qty 1

## 2020-02-18 MED ORDER — SCOPOLAMINE 1 MG/3DAYS TD PT72: 1.0000 | MEDICATED_PATCH | TRANSDERMAL | 0 refills | Status: DC

## 2020-02-18 MED ORDER — LACTATED RINGERS IV BOLUS
1000.0000 mL | Freq: Once | INTRAVENOUS | Status: AC
  Administered 2020-02-18: 1000 mL via INTRAVENOUS

## 2020-02-18 MED ORDER — SCOPOLAMINE 1 MG/3DAYS TD PT72
1.0000 | MEDICATED_PATCH | TRANSDERMAL | Status: DC
  Administered 2020-02-18: 1.5 mg via TRANSDERMAL
  Filled 2020-02-18: qty 1

## 2020-02-18 NOTE — MAU Provider Note (Signed)
History     CSN: [id number]  Arrival date and time: 02/18/20 0263   First Provider Initiated Contact with Patient 02/18/20 0850     29 y.o. G2P1001 @11 .2 wks presenting with N/V. Sx started 1.5 weeks ago. States only able to tolerate water. Denies fever, diarrhea, or sick contacts. Reports 2 lb weight loss. Denies VB or abd pain. She has tried Zofran, Unisom, and B6 without relief.   OB History    Gravida  2   Para  1   Term  1   Preterm  0   AB  0   Living  1     SAB  0   TAB  0   Ectopic  0   Multiple  0   Live Births  1           Past Medical History:  Diagnosis Date  . Breast mass 2011   benign by [place]  . Depression   . Ehler's-Danlos syndrome   . Seizures ([place])    last sz when 29 yrs old, was febrile, happened twice    Past Surgical History:  Procedure Laterality Date  . BREAST LUMPECTOMY Right     Family History  Problem Relation Age of Onset  . Ehlers-Danlos syndrome Brother   . Ehlers-Danlos syndrome Father   . Ehlers-Danlos syndrome Paternal Aunt   . Kidney disease Sister     Social History   Tobacco Use  . Smoking status: Never Smoker  . Smokeless tobacco: Never Used  Vaping Use  . Vaping Use: Never used  Substance Use Topics  . Alcohol use: No  . Drug use: No    Allergies:  Allergies  Allergen Reactions  . Penicillins Other (See Comments)    Childhood allergy, unknown reaction    Medications Prior to Admission  Medication Sig Dispense Refill Last Dose  . escitalopram (LEXAPRO) 10 MG tablet Take 10 mg by mouth daily.   02/17/2020 at Unknown time  . Prenatal Vit-Fe Fumarate-FA (PRENATAL MULTIVITAMIN) TABS Take 1 tablet by mouth daily at 12 noon. 30 tablet 3 02/17/2020 at Unknown time  . Prenatal Vit-Fe Fumarate-FA (GNP PRENATAL VITAMINS) 28-0.8 MG TABS Take 1 tablet by mouth every morning. 30 tablet 3     Review of Systems  Constitutional: Negative for fever.  Gastrointestinal: Positive for nausea and vomiting. Negative  for abdominal pain.  Genitourinary: Negative for vaginal bleeding.   Physical Exam   Blood pressure 116/79, pulse 88, temperature 98.4 F (36.9 C), temperature source Oral, resp. rate 17, height 5\' 2"  (1.575 m), weight 60.2 kg, SpO2 99 %, unknown if currently breastfeeding.  Physical Exam Vitals and nursing note reviewed. Exam conducted with a chaperone present.  Constitutional:      General: She is not in acute distress.    Appearance: Normal appearance.  HENT:     Head: Normocephalic and atraumatic.  Cardiovascular:     Rate and Rhythm: Normal rate.  Pulmonary:     Effort: Pulmonary effort is normal. No respiratory distress.  Abdominal:     General: There is no distension.     Palpations: Abdomen is soft.     Tenderness: There is no abdominal tenderness. There is no guarding or rebound.  Musculoskeletal:        General: Normal range of motion.     Cervical back: Normal range of motion.  Skin:    General: Skin is warm and dry.  Neurological:     General: No focal deficit present.  Mental Status: She is alert and oriented to person, place, and time.  Psychiatric:        Mood and Affect: Mood normal.        Behavior: Behavior normal.   FHT 158  Results for orders placed or performed during the hospital encounter of 02/18/20 (from the past 24 hour(s))  Urinalysis, Routine w reflex microscopic Urine, Clean Catch     Status: Abnormal   Collection Time: 02/18/20  7:14 AM  Result Value Ref Range   Color, Urine YELLOW YELLOW   APPearance CLOUDY (A) CLEAR   Specific Gravity, Urine 1.026 1.005 - 1.030   pH 6.0 5.0 - 8.0   Glucose, UA NEGATIVE NEGATIVE mg/dL   Hgb urine dipstick NEGATIVE NEGATIVE   Bilirubin Urine NEGATIVE NEGATIVE   Ketones, ur NEGATIVE NEGATIVE mg/dL   Protein, ur 30 (A) NEGATIVE mg/dL   Nitrite NEGATIVE NEGATIVE   Leukocytes,Ua MODERATE (A) NEGATIVE   RBC / HPF 0-5 0 - 5 RBC/hpf   WBC, UA 6-10 0 - 5 WBC/hpf   Bacteria, UA RARE (A) NONE SEEN    Squamous Epithelial / LPF 21-50 0 - 5   Mucus PRESENT   Pregnancy, urine POC     Status: Abnormal   Collection Time: 02/18/20  7:15 AM  Result Value Ref Range   Preg Test, Ur POSITIVE (A) NEGATIVE   MAU Course  Procedures LR Pepcid Scopolamine Phenergan  MDM Labs ordered and reviewed.  1250: Reassessment: slight improvement in nausea, tolerating po but feels dizzy and drowsy, BP 78/48, likely SE of Phenergan, will finish 2nd bag IVF and reassess. 1325: Continues to feel dizzy and drowsy, BP now 96/61, will remove Scop patch and reassess. 1455: Feeling better, dizziness improved. Ambulated w/o difficulty. Recommend avoiding Phenergan and Scopolamine in the future. Stable for discharge home.  Assessment and Plan   1. [redacted] weeks gestation of pregnancy   2. Morning sickness    Discharge home Follow up at [facility] as scheduled Rx Reglan Maintain hydration Return precautions OOW today and tomorrow- note provided  Allergies as of 02/18/2020      Reactions   Penicillins Other (See Comments)   Childhood allergy, unknown reaction      Medication List    STOP taking these medications   GNP Prenatal Vitamins 28-0.8 MG Tabs     TAKE these medications   escitalopram 10 MG tablet Commonly known as: LEXAPRO Take 10 mg by mouth daily.   metoCLOPramide 10 MG tablet Commonly known as: Reglan Take 1 tablet (10 mg total) by mouth every 6 (six) hours as needed for nausea or vomiting.   prenatal multivitamin Tabs tablet Take 1 tablet by mouth daily at 12 noon.      Julianne Handler, CNM 02/18/2020, 9:04 AM

## 2020-02-18 NOTE — Discharge Instructions (Signed)
Morning Sickness  Morning sickness is when you feel sick to your stomach (nauseous) during pregnancy. You may feel sick to your stomach and throw up (vomit). You may feel sick in the morning, but you can feel this way at any time of day. Some women feel very sick to their stomach and cannot stop throwing up (hyperemesis gravidarum). Follow these instructions at home: Medicines  Take over-the-counter and prescription medicines only as told by your doctor. Do not take any medicines until you talk with your doctor about them first.  Taking multivitamins before getting pregnant can stop or lessen the harshness of morning sickness. Eating and drinking  Eat dry toast or crackers before getting out of bed.  Eat 5 or 6 small meals a day.  Eat dry and bland foods like rice and baked potatoes.  Do not eat greasy, fatty, or spicy foods.  Have someone cook for you if the smell of food causes you to feel sick or throw up.  If you feel sick to your stomach after taking prenatal vitamins, take them at night or with a snack.  Eat protein when you need a snack. Nuts, yogurt, and cheese are good choices.  Drink fluids throughout the day.  Try ginger ale made with real ginger, ginger tea made from fresh grated ginger, or ginger candies. General instructions  Do not use any products that have nicotine or tobacco in them, such as cigarettes and e-cigarettes. If you need help quitting, ask your doctor.  Use an air purifier to keep the air in your house free of smells.  Get lots of fresh air.  Try to avoid smells that make you feel sick.  Try: ? Wearing a bracelet that is used for seasickness (acupressure wristband). ? Going to a doctor who puts thin needles into certain body points (acupuncture) to improve how you feel. Contact a doctor if:  You need medicine to feel better.  You feel dizzy or light-headed.  You are losing weight. Get help right away if:  You feel very sick to your  stomach and cannot stop throwing up.  You pass out (faint).  You have very bad pain in your belly. Summary  Morning sickness is when you feel sick to your stomach (nauseous) during pregnancy.  You may feel sick in the morning, but you can feel this way at any time of day.  Making some changes to what you eat may help your symptoms go away. This information is not intended to replace advice given to you by your health care provider. Make sure you discuss any questions you have with your health care provider. Document Revised: 04/21/2017 Document Reviewed: 06/09/2016 Elsevier Patient Education  2020 [employer].

## 2020-02-18 NOTE — MAU Note (Signed)
Can't keep anything down.  Going on for about a wk.  Has tried meds, not helping. Having really bad headaches.

## 2020-02-19 LAB — CULTURE, OB URINE

## 2020-02-21 ENCOUNTER — Ambulatory Visit: Payer: BC Managed Care – PPO | Admitting: Internal Medicine

## 2020-02-21 NOTE — Progress Notes (Deleted)
Cardiology Office Note:    Date:  02/21/2020   ID:  Jodi Dennis, DOB [birth year], MRN [id number]  PCP:  Caren Macadam, MD  [facility] Cardiologist:  No primary care provider on file.  [facility] Electrophysiologist:  None   Referring MD: Drema Dallas, DO   CC: Consulted for the evaluation of Ehlers-Danlos Type III (Non vascular time) at the behest of Caren Macadam, MD   History of Present Illness:    Jodi Dennis is a 29 y.o. female with a hx of Ehler's Danlos syndrome Type III (nonvasulcar type, ***no prior history of dissection).  Past Medical History:  Diagnosis Date  . Breast mass 2011   benign by [place]  . Depression   . Ehler's-Danlos syndrome   . Seizures ([place])    last sz when 29 yrs old, was febrile, happened twice    Past Surgical History:  Procedure Laterality Date  . BREAST LUMPECTOMY Right     Current Medications: No outpatient medications have been marked as taking for the 02/21/20 encounter (Appointment) with Werner Lean, MD.     Allergies:   Penicillins   Social History   Socioeconomic History  . Marital status: Single    Spouse name: Not on file  . Number of children: Not on file  . Years of education: Not on file  . Highest education level: Not on file  Occupational History  . Not on file  Tobacco Use  . Smoking status: Never Smoker  . Smokeless tobacco: Never Used  Vaping Use  . Vaping Use: Never used  Substance and Sexual Activity  . Alcohol use: No  . Drug use: No  . Sexual activity: Not Currently  Other Topics Concern  . Not on file  Social History Narrative   ** Merged History Encounter **       Social Determinants of Health   Financial Resource Strain:   . Difficulty of Paying Living Expenses: Not on file  Food Insecurity:   . Worried About Charity fundraiser in the Last Year: Not on file  . Ran Out of Food in the Last Year: Not on file  Transportation Needs:   . Lack of Transportation (Medical):  Not on file  . Lack of Transportation (Non-Medical): Not on file  Physical Activity:   . Days of Exercise per Week: Not on file  . Minutes of Exercise per Session: Not on file  Stress:   . Feeling of Stress : Not on file  Social Connections:   . Frequency of Communication with Friends and Family: Not on file  . Frequency of Social Gatherings with Friends and Family: Not on file  . Attends Religious Services: Not on file  . Active Member of Clubs or Organizations: Not on file  . Attends Archivist Meetings: Not on file  . Marital Status: Not on file     Family History: The patient's ***family history includes Ehlers-Danlos syndrome in her brother, father, and paternal aunt; Kidney disease in her sister.  ROS:   Please see the history of present illness.    *** All other systems reviewed and are negative.  EKGs/Labs/Other Studies Reviewed:    The following studies were reviewed today: ***  EKG:  EKG is *** ordered today.  The ekg ordered today demonstrates ***  Recent Labs: 02/18/2020: BUN 6; Creatinine, Ser 0.59; Hemoglobin 12.9; Platelets 303; Potassium 3.7; Sodium 133   Echo 06/21/2012 - Aortic Root 24 mm; normal biventricular function.  Physical Exam:  VS:  There were no vitals taken for this visit.    Wt Readings from Last 3 Encounters:  02/18/20 132 lb 12.8 oz (60.2 kg)  08/18/12 159 lb (72.1 kg)  08/17/12 159 lb (72.1 kg)     GEN: *** Well nourished, well developed in no acute distress HEENT: Normal NECK: No JVD; No carotid bruits LYMPHATICS: No lymphadenopathy CARDIAC: ***RRR, no murmurs, rubs, gallops RESPIRATORY:  Clear to auscultation without rales, wheezing or rhonchi  ABDOMEN: Soft, non-tender, non-distended MUSCULOSKELETAL:  No edema; No deformity  SKIN: Warm and dry NEUROLOGIC:  Alert and oriented x 3 PSYCHIATRIC:  Normal affect   ASSESSMENT:    No diagnosis found. PLAN:    In order of problems listed  above:  1. ***   Medication Adjustments/Labs and Tests Ordered: Current medicines are reviewed at length with the patient today.  Concerns regarding medicines are outlined above.  No orders of the defined types were placed in this encounter.  No orders of the defined types were placed in this encounter.   There are no Patient Instructions on file for this visit.   Signed, Werner Lean, MD  02/21/2020 7:59 AM    [facility]

## 2020-03-04 ENCOUNTER — Other Ambulatory Visit: Payer: Self-pay | Admitting: Certified Nurse Midwife

## 2020-03-13 ENCOUNTER — Ambulatory Visit: Payer: BC Managed Care – PPO | Admitting: Internal Medicine

## 2020-03-16 ENCOUNTER — Institutional Professional Consult (permissible substitution): Payer: BC Managed Care – PPO | Admitting: Plastic Surgery

## 2020-03-19 ENCOUNTER — Ambulatory Visit: Payer: BC Managed Care – PPO | Admitting: Internal Medicine

## 2020-03-25 ENCOUNTER — Ambulatory Visit (INDEPENDENT_AMBULATORY_CARE_PROVIDER_SITE_OTHER): Payer: BC Managed Care – PPO | Admitting: Plastic Surgery

## 2020-03-25 ENCOUNTER — Encounter: Payer: Self-pay | Admitting: Plastic Surgery

## 2020-03-25 ENCOUNTER — Other Ambulatory Visit: Payer: Self-pay

## 2020-03-25 VITALS — BP 114/74 | HR 83 | Temp 98.4°F | Ht 62.0 in | Wt 143.2 lb

## 2020-03-25 DIAGNOSIS — D1722 Benign lipomatous neoplasm of skin and subcutaneous tissue of left arm: Secondary | ICD-10-CM

## 2020-03-25 NOTE — Progress Notes (Signed)
   Referring Provider Caren Macadam, MD [place],  [place] [address]   CC:  Chief Complaint  Patient presents with  . Advice Only      Jodi Dennis is an 29 y.o. female.  HPI: Patient presents to discuss a mass on her left shoulder.  She was sent by her dermatologist to have it excised.  She feels like it is growing in size and it will bother her when a seatbelt presses over it and she is concerned about the diagnosis given its recent growth.  She would like to have it removed.  She is [redacted] weeks pregnant.  Allergies  Allergen Reactions  . Penicillins Other (See Comments)    Childhood allergy, unknown reaction    Outpatient Encounter Medications as of 03/25/2020  Medication Sig  . escitalopram (LEXAPRO) 10 MG tablet Take 10 mg by mouth daily.  . metoCLOPramide (REGLAN) 10 MG tablet Take 1 tablet (10 mg total) by mouth every 6 (six) hours as needed for nausea or vomiting.  . Prenatal Vit-Fe Fumarate-FA (PRENATAL MULTIVITAMIN) TABS Take 1 tablet by mouth daily at 12 noon.   No facility-administered encounter medications on file as of 03/25/2020.     Past Medical History:  Diagnosis Date  . Breast mass 2011   benign by [place]  . Depression   . Ehler's-Danlos syndrome   . Seizures ([place])    last sz when 29 yrs old, was febrile, happened twice    Past Surgical History:  Procedure Laterality Date  . BREAST LUMPECTOMY Right     Family History  Problem Relation Age of Onset  . Ehlers-Danlos syndrome Brother   . Ehlers-Danlos syndrome Father   . Ehlers-Danlos syndrome Paternal Aunt   . Kidney disease Sister     Social history: Denies tobacco use  Review of Systems General: Denies fevers, chills, weight loss CV: Denies chest pain, shortness of breath, palpitations  Physical Exam Vitals with BMI 03/25/2020 02/18/2020 02/18/2020  Height 5\' 2"  - -  Weight 143 lbs 3 oz - -  BMI 48.27 - -  Systolic 078 94 96  Diastolic 74 59 61  Pulse 83 65 66    General:  No  acute distress,  Alert and oriented, Non-Toxic, Normal speech and affect Examination shows a 5 center diameter subcutaneous mass superficial to the lateral aspect of the clavicle in the left shoulder.  It is mobile with no overlying skin changes.  Assessment/Plan Patient has what I suspect is a subcutaneous lipoma in the left anterior shoulder area.  We discussed excision.  We went over the options of excision under local versus excision in the operating room.  She seems to prefer operating room excision and I recommended that we do this after she has completed her pregnancy.  She is to come back and see [place] in about 6 months to revisit and will plan to move forward at that time with excision.  All of her questions were answered.  Cindra Presume 03/25/2020, 5:03 PM

## 2020-03-30 ENCOUNTER — Ambulatory Visit: Payer: BC Managed Care – PPO | Admitting: Internal Medicine

## 2020-03-30 NOTE — Progress Notes (Deleted)
Cardiology Office Note:    Date:  03/30/2020   ID:  Jodi Dennis, DOB [birth year], MRN [id number]  PCP:  Caren Macadam, MD  [facility] Cardiologist:  No primary care provider on file.  [facility] Electrophysiologist:  None   Referring MD: Drema Dallas, DO   No chief complaint on file. ***  History of Present Illness:    Jodi Dennis is a 29 y.o. female with a hx of Ehler's-Danlos III prior echo 2014 (Does not visualize the ascending aorta)  Past Medical History:  Diagnosis Date  . Breast mass 2011   benign by [place]  . Depression   . Ehler's-Danlos syndrome   . Seizures ([place])    last sz when 29 yrs old, was febrile, happened twice    Past Surgical History:  Procedure Laterality Date  . BREAST LUMPECTOMY Right     Current Medications: No outpatient medications have been marked as taking for the 03/30/20 encounter (Appointment) with Werner Lean, MD.     Allergies:   Penicillins   Social History   Socioeconomic History  . Marital status: Single    Spouse name: Not on file  . Number of children: Not on file  . Years of education: Not on file  . Highest education level: Not on file  Occupational History  . Not on file  Tobacco Use  . Smoking status: Never Smoker  . Smokeless tobacco: Never Used  Vaping Use  . Vaping Use: Never used  Substance and Sexual Activity  . Alcohol use: No  . Drug use: No  . Sexual activity: Not Currently  Other Topics Concern  . Not on file  Social History Narrative   ** Merged History Encounter **       Social Determinants of Health   Financial Resource Strain:   . Difficulty of Paying Living Expenses: Not on file  Food Insecurity:   . Worried About Charity fundraiser in the Last Year: Not on file  . Ran Out of Food in the Last Year: Not on file  Transportation Needs:   . Lack of Transportation (Medical): Not on file  . Lack of Transportation (Non-Medical): Not on file  Physical Activity:   . Days  of Exercise per Week: Not on file  . Minutes of Exercise per Session: Not on file  Stress:   . Feeling of Stress : Not on file  Social Connections:   . Frequency of Communication with Friends and Family: Not on file  . Frequency of Social Gatherings with Friends and Family: Not on file  . Attends Religious Services: Not on file  . Active Member of Clubs or Organizations: Not on file  . Attends Archivist Meetings: Not on file  . Marital Status: Not on file     Family History: The patient's ***family history includes Ehlers-Danlos syndrome in her brother, father, and paternal aunt; Kidney disease in her sister.  ROS:   Please see the history of present illness.    *** All other systems reviewed and are negative.  EKGs/Labs/Other Studies Reviewed:    The following studies were reviewed today: ***  EKG:  EKG is *** ordered today.  The ekg ordered today demonstrates ***  Recent Labs: 02/18/2020: BUN 6; Creatinine, Ser 0.59; Hemoglobin 12.9; Platelets 303; Potassium 3.7; Sodium 133  Recent Lipid Panel No results found for: CHOL, TRIG, HDL, CHOLHDL, VLDL, LDLCALC, LDLDIRECT  06/21/2012- Aortic root normal size; not visualized  Study Conclusions  Left ventricle: The  cavity size was normal. Wall thickness  was normal. Systolic function was normal. The estimated  ejection fraction was in the range of 55% to 60%. Features  are consistent with a pseudonormal left ventricular filling  pattern, with concomitant abnormal relaxation and increased  filling pressure (grade 2 diastolic dysfunction).  Transthoracic echocardiography. M-mode, complete 2D,  spectral Doppler, and color Doppler. Height: Height:  157.5cm. Height: 62in. Weight: Weight: 66.2kg. Weight:  145.7lb. Body mass index: BMI: 26.7kg/m^2. Body surface  area:  BSA: 1.64m^2. Blood pressure:   100/60. Patient  status: Outpatient. Location: [facility]     Physical Exam:    VS:  There were  no vitals taken for this visit.    Wt Readings from Last 3 Encounters:  03/25/20 143 lb 3.2 oz (65 kg)  02/18/20 132 lb 12.8 oz (60.2 kg)  08/18/12 159 lb (72.1 kg)     GEN: *** Well nourished, well developed in no acute distress HEENT: Normal NECK: No JVD; No carotid bruits LYMPHATICS: No lymphadenopathy CARDIAC: ***RRR, no murmurs, rubs, gallops RESPIRATORY:  Clear to auscultation without rales, wheezing or rhonchi  ABDOMEN: Soft, non-tender, non-distended MUSCULOSKELETAL:  No edema; No deformity  SKIN: Warm and dry NEUROLOGIC:  Alert and oriented x 3 PSYCHIATRIC:  Normal affect   ASSESSMENT:    No diagnosis found. PLAN:    In order of problems listed above:  Ehlers Danlos Type III    Shared Decision Making/Informed Consent      Medication Adjustments/Labs and Tests Ordered: Current medicines are reviewed at length with the patient today.  Concerns regarding medicines are outlined above.  No orders of the defined types were placed in this encounter.  No orders of the defined types were placed in this encounter.   There are no Patient Instructions on file for this visit.   Signed, Werner Lean, MD  03/30/2020 1:05 PM    [place]

## 2020-04-06 ENCOUNTER — Encounter: Payer: Self-pay | Admitting: General Practice

## 2020-05-23 NOTE — L&D Delivery Note (Signed)
Delivery Note Called to patient's room and she was found to be complete and at +1 station. At 10:14 PM a viable and healthy female was delivered via Vaginal, Spontaneous (Presentation: LOA).  APGAR:9 9, ; weight:  See infant's chart Placenta status:  Intact - sent to pathology.  Cord: 3 vessels   with the following complications: None.  Cord pH: Not collected  Hemostatic superficial periurethral tears were noted.  Otherwise, no vaginal, cervical, or perineal lacerations.  Anesthesia: Epidural Episiotomy:  N/A Lacerations:  None Suture Repair: N/A Est. Blood Loss (mL):  250cc  Mom to postpartum.  Baby to Couplet care / Skin to Skin.  Drema Dallas 08/21/2020, 10:26 PM

## 2020-06-30 ENCOUNTER — Other Ambulatory Visit (HOSPITAL_COMMUNITY): Payer: Self-pay | Admitting: Obstetrics and Gynecology

## 2020-06-30 DIAGNOSIS — Q796 Ehlers-Danlos syndrome, unspecified: Secondary | ICD-10-CM

## 2020-07-03 ENCOUNTER — Encounter (HOSPITAL_COMMUNITY): Payer: Self-pay | Admitting: Obstetrics and Gynecology

## 2020-07-17 ENCOUNTER — Telehealth (HOSPITAL_COMMUNITY): Payer: Self-pay | Admitting: Obstetrics and Gynecology

## 2020-07-17 NOTE — Telephone Encounter (Signed)
Just an FYI. We have made several attempts to contact this patient including sending a letter to schedule or reschedule their echocardiogram. We will be removing the patient from the echo [place].  07/03/20 MAILED LETTER [facility]  07/03/20 Called and would not go thru at this time@9 :08/[facility]  07/01/20 Called and call would not go thru at this time @ 10:49/LBW  06/30/20 called and NA x1 10:12/LB[facility]      Thank you

## 2020-07-21 ENCOUNTER — Ambulatory Visit (HOSPITAL_COMMUNITY): Payer: Medicaid Other | Attending: Cardiovascular Disease

## 2020-07-21 ENCOUNTER — Other Ambulatory Visit: Payer: Self-pay

## 2020-07-21 DIAGNOSIS — Q796 Ehlers-Danlos syndrome, unspecified: Secondary | ICD-10-CM | POA: Diagnosis present

## 2020-07-21 LAB — ECHOCARDIOGRAM COMPLETE
Area-P 1/2: 2.54 cm2
S' Lateral: 2.6 cm

## 2020-08-03 LAB — OB RESULTS CONSOLE GBS: GBS: NEGATIVE

## 2020-08-07 ENCOUNTER — Other Ambulatory Visit: Payer: Self-pay

## 2020-08-07 ENCOUNTER — Encounter (HOSPITAL_COMMUNITY): Payer: Self-pay | Admitting: Obstetrics and Gynecology

## 2020-08-07 ENCOUNTER — Inpatient Hospital Stay (HOSPITAL_COMMUNITY)
Admission: AD | Admit: 2020-08-07 | Discharge: 2020-08-08 | Disposition: A | Discharge status: Home / Self Care | Payer: BC Managed Care – PPO | Attending: Obstetrics and Gynecology | Admitting: Obstetrics and Gynecology

## 2020-08-07 DIAGNOSIS — O4703 False labor before 37 completed weeks of gestation, third trimester: Secondary | ICD-10-CM | POA: Diagnosis present

## 2020-08-07 DIAGNOSIS — Z880 Allergy status to penicillin: Secondary | ICD-10-CM | POA: Diagnosis not present

## 2020-08-07 DIAGNOSIS — Z79899 Other long term (current) drug therapy: Secondary | ICD-10-CM | POA: Diagnosis not present

## 2020-08-07 DIAGNOSIS — Z3A35 35 weeks gestation of pregnancy: Secondary | ICD-10-CM | POA: Diagnosis not present

## 2020-08-07 DIAGNOSIS — Z3689 Encounter for other specified antenatal screening: Secondary | ICD-10-CM

## 2020-08-07 LAB — URINALYSIS, ROUTINE W REFLEX MICROSCOPIC
Bilirubin Urine: NEGATIVE
Glucose, UA: NEGATIVE mg/dL
Hgb urine dipstick: NEGATIVE
Ketones, ur: NEGATIVE mg/dL
Nitrite: NEGATIVE
Protein, ur: NEGATIVE mg/dL
Specific Gravity, Urine: 1.013 (ref 1.005–1.030)
pH: 8 (ref 5.0–8.0)

## 2020-08-07 LAB — WET PREP, GENITAL
Clue Cells Wet Prep HPF POC: NONE SEEN
Sperm: NONE SEEN
Trich, Wet Prep: NONE SEEN
Yeast Wet Prep HPF POC: NONE SEEN

## 2020-08-07 NOTE — MAU Note (Signed)
Pt report she started having ctx on and off last night. About an hour ago they got closer together and stronger. Denies any vag bleeding or leaking at this time. Good fetal movement felt.

## 2020-08-07 NOTE — MAU Provider Note (Signed)
History     CSN: [id number]  Arrival date and time: 08/07/20 2144   Event Date/Time   First Provider Initiated Contact with Patient 08/07/20 2217      Chief Complaint  Patient presents with  . Contractions   Jodi Dennis is a 30 y.o. G2P1001 at [redacted]w[redacted]d who receives care at [facility].  She presents today for Contractions.  She states she has been contracting since last night, but that things have worsened in the past hour.  Patient describes the contractions as a "dull pain in my back that radiates to my abdomen and then the baby balls up."  She states it lasts ~30 seconds and is about every 2-3 minutes in the last hour. Patient rates the pain a 7/10 and feels it worsens with walking. Patient reports some pelvic pain.  She denies vaginal bleeding, discharge, and leaking.  She reports good fetal movement. Patient states she last ate 2 hours ago.    OB History    Gravida  2   Para  1   Term  1   Preterm  0   AB  0   Living  1     SAB  0   IAB  0   Ectopic  0   Multiple  0   Live Births  1           Past Medical History:  Diagnosis Date  . Breast mass 2011   benign by [place]  . Depression   . Ehler's-Danlos syndrome   . Seizures ([place])    last sz when 30 yrs old, was febrile, happened twice    Past Surgical History:  Procedure Laterality Date  . BREAST LUMPECTOMY Right     Family History  Problem Relation Age of Onset  . Ehlers-Danlos syndrome Brother   . Ehlers-Danlos syndrome Father   . Ehlers-Danlos syndrome Paternal Aunt   . Kidney disease Sister     Social History   Tobacco Use  . Smoking status: Never Smoker  . Smokeless tobacco: Never Used  Vaping Use  . Vaping Use: Never used  Substance Use Topics  . Alcohol use: No  . Drug use: No    Allergies:  Allergies  Allergen Reactions  . Penicillins Other (See Comments)    Childhood allergy, unknown reaction    Medications Prior to Admission  Medication Sig Dispense Refill Last Dose  .  Prenatal Vit-Fe Fumarate-FA (PRENATAL MULTIVITAMIN) TABS Take 1 tablet by mouth daily at 12 noon. 30 tablet 3 Past Month at Unknown time  . escitalopram (LEXAPRO) 10 MG tablet Take 10 mg by mouth daily.   08/05/2020  . metoCLOPramide (REGLAN) 10 MG tablet Take 1 tablet (10 mg total) by mouth every 6 (six) hours as needed for nausea or vomiting. 30 tablet 0 Unknown at Unknown time    Review of Systems  Gastrointestinal: Positive for abdominal pain. Negative for nausea and vomiting.  Genitourinary: Positive for pelvic pain. Negative for difficulty urinating, dysuria, vaginal bleeding and vaginal discharge.  Neurological: Positive for light-headedness. Negative for dizziness and headaches.   Physical Exam   Blood pressure 119/72, pulse (!) 114, temperature 98.7 F (37.1 C), resp. rate 18, height 5\' 2"  (1.575 m), weight 80.7 kg, unknown if currently breastfeeding.  Physical Exam Exam conducted with a chaperone present.  Constitutional:      Appearance: Normal appearance.  HENT:     Head: Normocephalic and atraumatic.  Eyes:     Conjunctiva/sclera: Conjunctivae normal.  Cardiovascular:  Rate and Rhythm: Normal rate.  Pulmonary:     Effort: Pulmonary effort is normal. No respiratory distress.  Genitourinary:    Comments: Sterile Speculum Exam: -Normal External Genitalia: Non tender, no apparent discharge at introitus.  -Vaginal Vault: Pink mucosa with good rugae. Scant amt thin white discharge -wet prep collected -Cervix:Pink, no lesions, cysts, or polyps.  Appears closed. No active bleeding from os-GC/CT collected -Bimanual Exam: Dilation: 2 Effacement (%): 50 Station: Ballotable Exam by:: Janett Billow [place] Carmack,cnm Musculoskeletal:        General: Normal range of motion.     Cervical back: Normal range of motion.  Skin:    General: Skin is warm and dry.  Neurological:     Mental Status: She is alert and oriented to person, place, and time.  Psychiatric:        Mood and Affect: Mood  normal.        Behavior: Behavior normal.        Thought Content: Thought content normal.     Fetal Assessment  bpm, Mod Var, -Decels, +Accels Toco: Mild contractions graphed at irregular intervals.   MAU Course   Results for orders placed or performed during the hospital encounter of 08/07/20 (from the past 24 hour(s))  Urinalysis, Routine w reflex microscopic Urine, Clean Catch     Status: Abnormal   Collection Time: 08/07/20 10:03 PM  Result Value Ref Range   Color, Urine YELLOW YELLOW   APPearance HAZY (A) CLEAR   Specific Gravity, Urine 1.013 1.005 - 1.030   pH 8.0 5.0 - 8.0   Glucose, UA NEGATIVE NEGATIVE mg/dL   Hgb urine dipstick NEGATIVE NEGATIVE   Bilirubin Urine NEGATIVE NEGATIVE   Ketones, ur NEGATIVE NEGATIVE mg/dL   Protein, ur NEGATIVE NEGATIVE mg/dL   Nitrite NEGATIVE NEGATIVE   Leukocytes,Ua MODERATE (A) NEGATIVE   RBC / HPF 0-5 0 - 5 RBC/hpf   WBC, UA 11-20 0 - 5 WBC/hpf   Bacteria, UA FEW (A) NONE SEEN   Squamous Epithelial / LPF 6-10 0 - 5  Wet prep, genital     Status: Abnormal   Collection Time: 08/07/20 10:43 PM   Specimen: PATH Cytology Cervicovaginal Ancillary Only  Result Value Ref Range   Yeast Wet Prep HPF POC NONE SEEN NONE SEEN   Trich, Wet Prep NONE SEEN NONE SEEN   Clue Cells Wet Prep HPF POC NONE SEEN NONE SEEN   WBC, Wet Prep HPF POC MANY (A) NONE SEEN   Sperm NONE SEEN    No results found.  MDM PE Labs: Wet Prep, GC/CT EFM Assessment and Plan  30 year old G2P1001  SIUP at 35.5weeks Cat I FT Contractions, Preterm  -POC Reviewed -Exam performed and findings discussed. -Cultures collected and pending.  -Patient instructed to notify her primary ob so additional cultures will not be performed during GBS collection.  -Patient offered and declines pain medication. -Will monitor and reassess.    Maryann Conners MSN, CNM 08/07/2020, 10:17 PM   Reassessment (12:06 AM)  -Wet prep returns negative. -Informed that few bacteria  seen in UA and will send for culture. -GC/Ct remains pending. -Cervical exam remains unchanged. -Encouraged to rest and hydrate throughout the weekend. -Patient to follow up as scheduled for Monday  March 21. -Encouraged to call or return to MAU if symptoms worsen or with the onset of new symptoms. -Discharged to home in stable condition.  Maryann Conners MSN, CNM Advanced Practice Provider, [facility]

## 2020-08-08 DIAGNOSIS — O4703 False labor before 37 completed weeks of gestation, third trimester: Secondary | ICD-10-CM | POA: Diagnosis not present

## 2020-08-08 NOTE — Discharge Instructions (Signed)

## 2020-08-09 LAB — CULTURE, OB URINE

## 2020-08-10 LAB — CULTURE, OB URINE

## 2020-08-10 LAB — GC/CHLAMYDIA PROBE AMP (~~LOC~~) NOT AT ARMC
Chlamydia: NEGATIVE
Comment: NEGATIVE
Comment: NORMAL
Neisseria Gonorrhea: NEGATIVE

## 2020-08-21 ENCOUNTER — Other Ambulatory Visit: Payer: Self-pay

## 2020-08-21 ENCOUNTER — Inpatient Hospital Stay (HOSPITAL_COMMUNITY)
Admission: AD | Admit: 2020-08-21 | Discharge: 2020-08-23 | DRG: 806 | Disposition: A | Discharge status: Home / Self Care | Payer: BC Managed Care – PPO | Attending: Obstetrics and Gynecology | Admitting: Obstetrics and Gynecology

## 2020-08-21 ENCOUNTER — Encounter (HOSPITAL_COMMUNITY): Payer: Self-pay | Admitting: Obstetrics and Gynecology

## 2020-08-21 ENCOUNTER — Inpatient Hospital Stay (HOSPITAL_COMMUNITY): Payer: BC Managed Care – PPO | Admitting: Anesthesiology

## 2020-08-21 DIAGNOSIS — Z3A37 37 weeks gestation of pregnancy: Secondary | ICD-10-CM

## 2020-08-21 DIAGNOSIS — O4292 Full-term premature rupture of membranes, unspecified as to length of time between rupture and onset of labor: Principal | ICD-10-CM | POA: Diagnosis present

## 2020-08-21 DIAGNOSIS — O99344 Other mental disorders complicating childbirth: Secondary | ICD-10-CM | POA: Diagnosis present

## 2020-08-21 DIAGNOSIS — O4290 Premature rupture of membranes, unspecified as to length of time between rupture and onset of labor, unspecified weeks of gestation: Secondary | ICD-10-CM | POA: Diagnosis present

## 2020-08-21 DIAGNOSIS — Q796 Ehlers-Danlos syndrome, unspecified: Secondary | ICD-10-CM

## 2020-08-21 DIAGNOSIS — F32A Depression, unspecified: Secondary | ICD-10-CM | POA: Diagnosis present

## 2020-08-21 DIAGNOSIS — O403XX0 Polyhydramnios, third trimester, not applicable or unspecified: Secondary | ICD-10-CM | POA: Diagnosis present

## 2020-08-21 DIAGNOSIS — O99892 Other specified diseases and conditions complicating childbirth: Secondary | ICD-10-CM | POA: Diagnosis present

## 2020-08-21 DIAGNOSIS — Z20822 Contact with and (suspected) exposure to covid-19: Secondary | ICD-10-CM | POA: Diagnosis present

## 2020-08-21 DIAGNOSIS — F419 Anxiety disorder, unspecified: Secondary | ICD-10-CM | POA: Diagnosis present

## 2020-08-21 LAB — CBC
HCT: 32.1 % — ABNORMAL LOW (ref 36.0–46.0)
Hemoglobin: 9.9 g/dL — ABNORMAL LOW (ref 12.0–15.0)
MCH: 21.5 pg — ABNORMAL LOW (ref 26.0–34.0)
MCHC: 30.8 g/dL (ref 30.0–36.0)
MCV: 69.8 fL — ABNORMAL LOW (ref 80.0–100.0)
Platelets: 350 10*3/uL (ref 150–400)
RBC: 4.6 MIL/uL (ref 3.87–5.11)
RDW: 18.9 % — ABNORMAL HIGH (ref 11.5–15.5)
WBC: 7.6 10*3/uL (ref 4.0–10.5)
nRBC: 0 % (ref 0.0–0.2)

## 2020-08-21 LAB — RESP PANEL BY RT-PCR (FLU A&B, COVID) ARPGX2
Influenza A by PCR: NEGATIVE
Influenza B by PCR: NEGATIVE
SARS Coronavirus 2 by RT PCR: NEGATIVE

## 2020-08-21 LAB — TYPE AND SCREEN
ABO/RH(D): O POS
Antibody Screen: NEGATIVE

## 2020-08-21 LAB — POCT FERN TEST: POCT Fern Test: POSITIVE

## 2020-08-21 MED ORDER — EPHEDRINE 5 MG/ML INJ: 10.0000 mg | INTRAVENOUS | Status: DC | PRN

## 2020-08-21 MED ORDER — LACTATED RINGERS IV SOLN
500.0000 mL | INTRAVENOUS | Status: DC | PRN
Start: 2020-08-21 — End: 2020-08-22
  Administered 2020-08-21: 500 mL via INTRAVENOUS

## 2020-08-21 MED ORDER — PHENYLEPHRINE 40 MCG/ML (10ML) SYRINGE FOR IV PUSH (FOR BLOOD PRESSURE SUPPORT)
80.0000 ug | PREFILLED_SYRINGE | INTRAVENOUS | Status: DC | PRN
  Filled 2020-08-21: qty 10

## 2020-08-21 MED ORDER — OXYCODONE-ACETAMINOPHEN 5-325 MG PO TABS: 1.0000 | ORAL_TABLET | ORAL | Status: DC | PRN

## 2020-08-21 MED ORDER — OXYTOCIN-SODIUM CHLORIDE 30-0.9 UT/500ML-% IV SOLN
2.5000 [IU]/h | INTRAVENOUS | Status: DC
  Administered 2020-08-21: 2.5 [IU]/h via INTRAVENOUS

## 2020-08-21 MED ORDER — LIDOCAINE HCL (PF) 1 % IJ SOLN
INTRAMUSCULAR | Status: DC | PRN
  Administered 2020-08-21: 4 mL via EPIDURAL
  Administered 2020-08-21: 5 mL via EPIDURAL

## 2020-08-21 MED ORDER — TERBUTALINE SULFATE 1 MG/ML IJ SOLN: 0.2500 mg | Freq: Once | INTRAMUSCULAR | Status: DC | PRN

## 2020-08-21 MED ORDER — OXYTOCIN BOLUS FROM INFUSION
333.0000 mL | Freq: Once | INTRAVENOUS | Status: AC
  Administered 2020-08-21: 333 mL via INTRAVENOUS

## 2020-08-21 MED ORDER — OXYCODONE-ACETAMINOPHEN 5-325 MG PO TABS: 2.0000 | ORAL_TABLET | ORAL | Status: DC | PRN

## 2020-08-21 MED ORDER — FENTANYL CITRATE (PF) 100 MCG/2ML IJ SOLN: 50.0000 ug | INTRAMUSCULAR | Status: DC | PRN

## 2020-08-21 MED ORDER — LACTATED RINGERS IV SOLN: 500.0000 mL | Freq: Once | INTRAVENOUS | Status: DC

## 2020-08-21 MED ORDER — ONDANSETRON HCL 4 MG/2ML IJ SOLN: 4.0000 mg | Freq: Four times a day (QID) | INTRAMUSCULAR | Status: DC | PRN

## 2020-08-21 MED ORDER — LACTATED RINGERS IV SOLN: INTRAVENOUS | Status: DC

## 2020-08-21 MED ORDER — LIDOCAINE HCL (PF) 1 % IJ SOLN: 30.0000 mL | INTRAMUSCULAR | Status: DC | PRN

## 2020-08-21 MED ORDER — SOD CITRATE-CITRIC ACID 500-334 MG/5ML PO SOLN: 30.0000 mL | ORAL | Status: DC | PRN

## 2020-08-21 MED ORDER — FENTANYL-BUPIVACAINE-NACL 0.5-0.125-0.9 MG/250ML-% EP SOLN
12.0000 mL/h | EPIDURAL | Status: DC | PRN
  Administered 2020-08-21: 14 mL/h via EPIDURAL
  Filled 2020-08-21: qty 250

## 2020-08-21 MED ORDER — OXYTOCIN-SODIUM CHLORIDE 30-0.9 UT/500ML-% IV SOLN
1.0000 m[IU]/min | INTRAVENOUS | Status: DC
  Administered 2020-08-21: 2 m[IU]/min via INTRAVENOUS
  Filled 2020-08-21: qty 500

## 2020-08-21 MED ORDER — PHENYLEPHRINE 40 MCG/ML (10ML) SYRINGE FOR IV PUSH (FOR BLOOD PRESSURE SUPPORT)
80.0000 ug | PREFILLED_SYRINGE | INTRAVENOUS | Status: DC | PRN
  Administered 2020-08-21 (×2): 80 ug via INTRAVENOUS
  Filled 2020-08-21: qty 10

## 2020-08-21 MED ORDER — DIPHENHYDRAMINE HCL 50 MG/ML IJ SOLN: 12.5000 mg | INTRAMUSCULAR | Status: DC | PRN

## 2020-08-21 MED ORDER — ACETAMINOPHEN 325 MG PO TABS: 650.0000 mg | ORAL_TABLET | ORAL | Status: DC | PRN

## 2020-08-21 NOTE — Lactation Note (Signed)
This note was copied from a baby's chart. Lactation Consultation Note  Patient Name: Jodi Dennis PBDHD'I Date: 08/21/2020 Reason for consult: L&D Initial assessment;Early term 37-38.6wks Age:30 hours  L&D consult with 45 minutes old infant and P2 mother. Parents are present at time of consult. Congratulated them on their newborn. Assisted with latch laid back position left breast. Discussed STS as ideal transition for infants after birth helping with temperature, blood sugar and comfort. Talked about primal reflexes such as rooting, hands to mouth, searching for the breast among others.   Explained [place] services availability during postpartum stay. Thanked family for their time.    Maternal Data Does the patient have breastfeeding experience prior to this delivery?: Yes How long did the patient breastfeed?: >6 months about 8 years ago  Feeding Mother's Current Feeding Choice: Breast Milk  LATCH Score Latch: Grasps breast easily, tongue down, lips flanged, rhythmical sucking.  Audible Swallowing: Spontaneous and intermittent  Type of Nipple: Everted at rest and after stimulation  Comfort (Breast/Nipple): Soft / non-tender  Hold (Positioning): Assistance needed to correctly position infant at breast and maintain latch.  LATCH Score: 9  Interventions Interventions: Assisted with latch;Skin to skin;Adjust position  Consult Status Consult Status: Follow-up Date: 08/21/20 Follow-up type: In-patient    [name] A Higuera Ancidey 08/21/2020, 10:54 PM

## 2020-08-21 NOTE — Progress Notes (Signed)
OB Progress Note  S: Patient resting comfortably with epidural. Consents to IUPC placement.  O: BP 109/67 (BP Location: Right Arm)   Pulse 88   Temp 98.3 F (36.8 C) (Oral)   Resp 18   Ht 5\' 2"  (1.575 m)   Wt 82 kg   SpO2 100%   BMI 33.05 kg/m   FHT: 140bpm, moderate variablity, + accels, previous recurrent late decels Toco: q1 minute SVE: 5-6/80/-3, forebag and sutures palpable Forebag ruptured with copious clear fluid  A/P: 30 y.o. G2P1001  @ [redacted]w[redacted]d admitted for IOL - PROM.  FWB: Cat. I Labor course: Pitocin as tolerated, forebag ruptured, IUPC placed Pain: Comfortable with epidural GBS: Negative Anticipate SVD  Drema Dallas, DO [phone] (office)

## 2020-08-21 NOTE — Anesthesia Preprocedure Evaluation (Signed)
Anesthesia Evaluation  Patient identified by MRN, date of birth, ID band Patient awake    Reviewed: Allergy & Precautions, NPO status , Patient's Chart, lab work & pertinent test results  History of Anesthesia Complications Negative for: history of anesthetic complications  Airway Mallampati: II  TM Distance: >3 FB Neck ROM: Full    Dental   Pulmonary neg pulmonary ROS,    Pulmonary exam normal        Cardiovascular negative cardio ROS Normal cardiovascular exam     Neuro/Psych Seizures - (febrile seizures many yrs ago), Well Controlled,  PSYCHIATRIC DISORDERS Depression    GI/Hepatic negative GI ROS, Neg liver ROS,   Endo/Other   Obesity   Renal/GU negative Renal ROS     Musculoskeletal  Ehler's-Danlos syndrome, type 3     Abdominal   Peds  Hematology  (+) anemia ,  Plt 350k    Anesthesia Other Findings Covid test negative   Reproductive/Obstetrics (+) Pregnancy                            Anesthesia Physical Anesthesia Plan  ASA: II  Anesthesia Plan: Epidural   Post-op Pain Management:    Induction:   PONV Risk Score and Plan: 2 and Treatment may vary due to age or medical condition  Airway Management Planned: Natural Airway  Additional Equipment: None  Intra-op Plan:   Post-operative Plan:   Informed Consent: I have reviewed the patients History and Physical, chart, labs and discussed the procedure including the risks, benefits and alternatives for the proposed anesthesia with the patient or authorized representative who has indicated his/her understanding and acceptance.       Plan Discussed with: Anesthesiologist  Anesthesia Plan Comments: (Labs reviewed. Platelets acceptable, patient not taking any blood thinning medications. Per RN, FHR tracing reported to be stable enough for sitting procedure. Risks and benefits discussed with patient, including PDPH,  backache, epidural hematoma, failed epidural, blood pressure changes, allergic reaction, and nerve injury. Patient expressed understanding and wished to proceed.)        Anesthesia Quick Evaluation

## 2020-08-21 NOTE — Anesthesia Procedure Notes (Signed)
Epidural Patient location during procedure: OB Start time: 08/21/2020 5:00 PM End time: 08/21/2020 5:04 PM  Staffing Anesthesiologist: Audry Pili, MD Performed: anesthesiologist   Preanesthetic Checklist Completed: patient identified, IV checked, risks and benefits discussed, monitors and equipment checked, pre-op evaluation and timeout performed  Epidural Patient position: sitting Prep: DuraPrep Patient monitoring: continuous pulse ox and blood pressure Approach: midline Location: L2-L3 Injection technique: LOR saline  Needle:  Needle type: Tuohy  Needle gauge: 17 G Needle length: 9 cm Needle insertion depth: 5 cm Catheter size: 19 Gauge Catheter at skin depth: 10 cm Test dose: negative and Other (1% lidocaine)  Assessment Events: blood not aspirated  Additional Notes Patient identified. Risks including, but not limited to, bleeding, infection, nerve damage, paralysis, inadequate analgesia, blood pressure changes, nausea, vomiting, allergic reaction, postpartum back pain, itching, and headache were discussed. Patient expressed understanding and wished to proceed. Sterile prep and drape, including hand hygiene, mask, and sterile gloves were used. The patient was positioned and the spine was prepped. The skin was anesthetized with lidocaine. No paraesthesia or other complication noted. The patient did not experience any signs of intravascular injection such as tinnitus or metallic taste in mouth, nor signs of intrathecal spread such as rapid motor block. Please see nursing notes for vital signs. The patient tolerated the procedure well.   Renold Don, MDReason for block:procedure for pain

## 2020-08-21 NOTE — H&P (Signed)
HPI: 30 y.o. G2P1001 @ [redacted]w[redacted]d estimated gestational age (as dated by LMP c/w 7 week ultrasound) presents for leakage of fluid. Confirmed PROM in MAU. Just received epidural and is comfortable.  Leakage of fluid:  Yes Vaginal bleeding:  None Contractions:  Present Fetal movement:  Yes  Prenatal care has been provided by Dr. Drema Dallas [facility])  ROS:  Denies fevers, chills, chest pain, visual changes, SOB, RUQ/epigastric pain, N/V, dysuria, hematuria, or sudden onset/worsening bilateral LE or facial edema.  Pregnancy complicated by: Polyhydramnios (AFI 28cm on 3/14) Ehlers-Danlos Syndrome Type 3 Anxiety/depression  Prenatal Transfer Tool  Maternal Diabetes: No Genetic Screening: Normal Maternal Ultrasounds/Referrals: Other: Polyhydramnios Fetal Ultrasounds or other Referrals:  None Maternal Substance Abuse:  No Significant Maternal Medications:  Meds include: Other: Lexapro 10mg  daily Significant Maternal Lab Results: Group B Strep negative   Prenatal Labs Blood type:  O positive Antibody screen:  Negative CBC:  H/H 10/30.5 Rubella: Immune RPR:  Non-reactive Hep B:  Negative Hep C:  Negative HIV:  Negative GC/CT:  Negative Glucola:  85.2 (wnl)  Immunizations: Tdap: Given prenatally Flu: Previously declined  OBHx:  OB History    Gravida  2   Para  1   Term  1   Preterm  0   AB  0   Living  1     SAB  0   IAB  0   Ectopic  0   Multiple  0   Live Births  1          PMHx:  See above Meds:  PNV, Lexapro 10mg  daily Allergy:   Allergies  Allergen Reactions  . Penicillins Other (See Comments)    Childhood allergy, unknown reaction   SurgHx: None SocHx:   Denies Tobacco, ETOH, illicit drugs  O: BP 829/56   Pulse 100   Ht 5\' 2"  (1.575 m)   Wt 82 kg   SpO2 98%   BMI 33.05 kg/m  Gen. AAOx3, NAD CV.  RRR  Resp. CTAB, no wheezes/rales/rhonchi Abd. Gravid, soft, non-tender throughout, no rebound/guarding Extr.  Trace bilateral LE  edema, no calf tenderness bilaterally SVE: 4/50/-3 (by primary RN at 1735)  Last growth [place]:  [redacted]w[redacted]d, EFW 2130Q (65%), cephalic, posterior placenta, AFI 28cm  FHT: 140 baseline, moderate variability, + accels,  - decels Toco: q min  Labs: see orders  A/P:  30 y.o. G2P1001 @ [redacted]w[redacted]d who presents for induction of labor for PROM.  IOL - PROM - Admit to L&D - Admit labs (CBC, T&S, COVID screen) - CEFM/Toco - FWB:  Category I - Diet:  Clear liquids - IVF:  LR at 125cc/hour - VTE Prophylaxis:  SCDs - GBS Status:  Negative - Presentation:  Cephalic by sutures - Pain control:  Per patient request - Induction method:  Pitocin - Anticipate SVD  Polyhydramnios - AFI 28cm on 3/14  Ehlers-Danlos Syndrome Type 3 - Diagnosed at age 34 - Experiences joint fatigue - Baseline Echocardiogram (07/21/20):  Left ventricular EF 65-70%, normal left ventricular function, no regional wall motion abnormalities of left ventricles, otherwise normal  Anxiety/depression - Medications:  Lexapro 20mg  qD - Will need 2 week EPDS postpartum  Drema Dallas, DO [phone] (office)

## 2020-08-21 NOTE — MAU Note (Signed)
...  Jodi Dennis is a 30 y.o. at [redacted]w[redacted]d here in MAU reporting: she felt a gush of fluids an hour ago while having a CTX on the couch. Patient states it was clear and no odor but a "little bit of brown." She was seen in office today and had a cervical exam. SVE 4cm. No VB. +FM.    Pain score: 6/10 upper/mid abdomen FHT: 129 Lab orders placed from triage:

## 2020-08-22 ENCOUNTER — Encounter (HOSPITAL_COMMUNITY): Payer: Self-pay | Admitting: Obstetrics and Gynecology

## 2020-08-22 LAB — CBC
HCT: 28.8 % — ABNORMAL LOW (ref 36.0–46.0)
Hemoglobin: 9 g/dL — ABNORMAL LOW (ref 12.0–15.0)
MCH: 21.5 pg — ABNORMAL LOW (ref 26.0–34.0)
MCHC: 31.3 g/dL (ref 30.0–36.0)
MCV: 68.7 fL — ABNORMAL LOW (ref 80.0–100.0)
Platelets: 271 10*3/uL (ref 150–400)
RBC: 4.19 MIL/uL (ref 3.87–5.11)
RDW: 18.7 % — ABNORMAL HIGH (ref 11.5–15.5)
WBC: 10.8 10*3/uL — ABNORMAL HIGH (ref 4.0–10.5)
nRBC: 0 % (ref 0.0–0.2)

## 2020-08-22 LAB — RPR: RPR Ser Ql: NONREACTIVE

## 2020-08-22 MED ORDER — BENZOCAINE-MENTHOL 20-0.5 % EX AERO
1.0000 "application " | INHALATION_SPRAY | CUTANEOUS | Status: DC | PRN
  Administered 2020-08-22: 1 via TOPICAL
  Filled 2020-08-22: qty 56

## 2020-08-22 MED ORDER — IBUPROFEN 800 MG PO TABS
800.0000 mg | ORAL_TABLET | Freq: Three times a day (TID) | ORAL | Status: DC | PRN
  Administered 2020-08-22 – 2020-08-23 (×4): 800 mg via ORAL
  Filled 2020-08-22 (×4): qty 1

## 2020-08-22 MED ORDER — SIMETHICONE 80 MG PO CHEW: 80.0000 mg | CHEWABLE_TABLET | ORAL | Status: DC | PRN

## 2020-08-22 MED ORDER — COCONUT OIL OIL
1.0000 "application " | TOPICAL_OIL | Status: DC | PRN
  Administered 2020-08-22: 1 via TOPICAL

## 2020-08-22 MED ORDER — ACETAMINOPHEN 325 MG PO TABS
650.0000 mg | ORAL_TABLET | ORAL | Status: DC | PRN
  Administered 2020-08-22 – 2020-08-23 (×5): 650 mg via ORAL
  Filled 2020-08-22 (×5): qty 2

## 2020-08-22 MED ORDER — ONDANSETRON HCL 4 MG/2ML IJ SOLN: 4.0000 mg | INTRAMUSCULAR | Status: DC | PRN

## 2020-08-22 MED ORDER — PRENATAL MULTIVITAMIN CH
1.0000 | ORAL_TABLET | Freq: Every day | ORAL | Status: DC
  Administered 2020-08-22: 1 via ORAL
  Filled 2020-08-22: qty 1

## 2020-08-22 MED ORDER — ZOLPIDEM TARTRATE 5 MG PO TABS: 5.0000 mg | ORAL_TABLET | Freq: Every evening | ORAL | Status: DC | PRN

## 2020-08-22 MED ORDER — DIBUCAINE (PERIANAL) 1 % EX OINT
1.0000 | TOPICAL_OINTMENT | CUTANEOUS | Status: DC | PRN
Start: 2020-08-21 — End: 2020-08-23

## 2020-08-22 MED ORDER — ESCITALOPRAM OXALATE 10 MG PO TABS
10.0000 mg | ORAL_TABLET | Freq: Every day | ORAL | Status: DC
  Administered 2020-08-22 – 2020-08-23 (×2): 10 mg via ORAL
  Filled 2020-08-22 (×2): qty 1

## 2020-08-22 MED ORDER — FERROUS SULFATE 325 (65 FE) MG PO TABS
325.0000 mg | ORAL_TABLET | Freq: Every day | ORAL | Status: DC
  Administered 2020-08-23: 325 mg via ORAL
  Filled 2020-08-22: qty 1

## 2020-08-22 MED ORDER — WITCH HAZEL-GLYCERIN EX PADS
1.0000 | MEDICATED_PAD | CUTANEOUS | Status: DC | PRN
Start: 2020-08-21 — End: 2020-08-23

## 2020-08-22 MED ORDER — DIPHENHYDRAMINE HCL 25 MG PO CAPS: 25.0000 mg | ORAL_CAPSULE | Freq: Four times a day (QID) | ORAL | Status: DC | PRN

## 2020-08-22 MED ORDER — SENNOSIDES-DOCUSATE SODIUM 8.6-50 MG PO TABS
2.0000 | ORAL_TABLET | Freq: Every day | ORAL | Status: DC
  Administered 2020-08-22 – 2020-08-23 (×2): 2 via ORAL
  Filled 2020-08-22 (×2): qty 2

## 2020-08-22 MED ORDER — ONDANSETRON HCL 4 MG PO TABS: 4.0000 mg | ORAL_TABLET | ORAL | Status: DC | PRN

## 2020-08-22 NOTE — Lactation Note (Addendum)
This note was copied from a baby's chart. Lactation Consultation Note  Patient Name: Jodi Dennis [id number] Date: 08/22/2020 Reason for consult: Follow-up assessment Age:30 hours   LC Follow Up Visit:  RN requested assistance to set up the DEBP.  Mother awake but stated that her son fed well 2 hours ago.  She is interested in using the DEBP, however, desires to wait until later today.  Will return later today when mother calls  for pump set up.  RN updated.   Maternal Data    Feeding    LATCH Score                    Lactation Tools Discussed/Used    Interventions    Discharge    Consult Status Consult Status: Follow-up Date: 08/22/20 Follow-up type: In-patient    Jodi Dennis 08/22/2020, 6:33 AM

## 2020-08-22 NOTE — Anesthesia Postprocedure Evaluation (Signed)
Anesthesia Post Note  Patient: Jodi Dennis  Procedure(s) Performed: AN AD [place]     Patient location during evaluation: Mother Baby Anesthesia Type: Epidural Level of consciousness: awake and alert, oriented and patient cooperative Pain management: pain level controlled Vital Signs Assessment: post-procedure vital signs reviewed and stable Respiratory status: spontaneous breathing Cardiovascular status: stable Postop Assessment: no headache, epidural receding, patient able to bend at knees and no signs of nausea or vomiting Anesthetic complications: no Comments: Pt. States she is walking. Pain score 5.    No complications documented.  Last Vitals:  Vitals:   08/22/20 0139 08/22/20 0606  BP: (!) 102/54 (!) 95/41  Pulse: 95 95  Resp: 18 18  Temp: 36.7 C 36.8 C  SpO2:      Last Pain:  Vitals:   08/22/20 0606  TempSrc: Oral  PainSc:    Pain Goal:                Epidural/Spinal Function Cutaneous sensation: Normal sensation (08/22/20 0811)  Rico Sheehan

## 2020-08-22 NOTE — Progress Notes (Signed)
MOB was referred for history of depression/anxiety. * Referral screened out by Clinical Social Worker because none of the following criteria appear to apply: ~ History of anxiety/depression during this pregnancy, or of post-partum depression. ~ Diagnosis of anxiety and/or depression within last 3 years OR * MOB's symptoms currently being treated with medication and/or therapy. MOB has an active Rx for Lexapro and per OB records it is currently managing her symptoms.   Edinburgh score was 7.  Please contact the Clinical Social Worker if needs arise, or if MOB requests.  Laurey Arrow, MSW, LCSW Clinical Social Work [phone]

## 2020-08-22 NOTE — Progress Notes (Signed)
Postpartum Note Day #1  S:  Patient doing well.  Pain controlled.  Tolerating regular diet.   Ambulating and voiding without difficulty. Desires discharge home tomorrow. Denies fevers, chills, chest pain, SOB, N/V, or worsening bilateral LE edema.  Lochia: Moderate Infant feeding:  Breast Circumcision:  Desires prior to discharge Contraception:  None  O: Temp:  [98 F (36.7 C)-98.8 F (37.1 C)] 98.2 F (36.8 C) (04/02 0606) Pulse Rate:  [61-104] 95 (04/02 0606) Resp:  [17-18] 18 (04/02 0606) BP: (89-123)/(41-81) 95/41 (04/02 0606) SpO2:  [98 %-100 %] 100 % (04/01 1741) Weight:  [82 kg] 82 kg (04/01 1415) Gen: NAD, pleasant and cooperative CV: RRR Resp: CTAB, no wheezes/rales/rhonchi Abdomen: soft, non-distended, non-tender throughout Uterus: firm, non-tender, below umbilicus Ext: No bilateral LE edema, no bilateral calf tenderness, SCDs on and working  Labs: Recent Labs    08/21/20 1454 08/22/20 0415  HGB 9.9* 9.0*    A/P: Patient is a 30 y.o. [id number] PPD#1 s/p SVD.  S/p SVD - Pain well controlled  - GU: UOP is adequate - GI: Tolerating regular diet - Activity: encouraged sitting up to chair and ambulation as tolerated - DVT Prophylaxis: SCDs in bed, frequent ambulation - Labs: stable as above - iron ordered  Ehlers-Danlos Syndrome Type 3 - Diagnosed at age 31 - Experiences joint fatigue - Baseline Echocardiogram (07/21/20):  Left ventricular EF 65-70%, normal left ventricular function, no regional wall motion abnormalities of left ventricles, otherwise normal  Anxiety/depression - Medications:  Lexapro 20mg  qD - Will need 2 week EPDS postpartum  Circumcision consent: Routine circumcisions performed on newborns have been identified as voluntary, elective procedures by [employer] such as the [employer] of Pediatrics.  It is considered an elective procedure with no definitive medical indication and carries risks.  Risks include but are not limited  to bleeding, infection, damage to penis with possible need for further surgery, poor cosmesis, and local anesthetic risks.  Circumcision will only be performed if patient is deemed to have normal anatomy by his Pediatrician, meets adequate criteria for a newborn of similar gestational age after birth and is without infection or other medical issue contraindicating an elective procedure.   Patient understands and agrees with above consent Patient discussed with parents of infant  Disposition:  D/C home PPD#2   Drema Dallas, DO [phone] (office)

## 2020-08-22 NOTE — Plan of Care (Signed)
  Problem: Education: Goal: Knowledge of condition will improve Outcome: Completed/Met   Problem: Activity: Goal: Will verbalize the importance of balancing activity with adequate rest periods Outcome: Completed/Met Goal: Ability to tolerate increased activity will improve Outcome: Completed/Met   Problem: Coping: Goal: Ability to identify and utilize available resources and services will improve Outcome: Completed/Met   Problem: Life Cycle: Goal: Chance of risk for complications during the postpartum period will decrease Outcome: Completed/Met   Problem: Role Relationship: Goal: Ability to demonstrate positive interaction with newborn will improve Outcome: Completed/Met   Problem: Skin Integrity: Goal: Demonstration of wound healing without infection will improve Outcome: Completed/Met   

## 2020-08-22 NOTE — Anesthesia Postprocedure Evaluation (Deleted)
Anesthesia Post Note  Patient: Jodi Dennis  Procedure(s) Performed: AN AD [place]     Patient location during evaluation: Mother Baby Anesthesia Type: Epidural Level of consciousness: awake and alert, oriented and patient cooperative Pain management: pain level controlled Vital Signs Assessment: post-procedure vital signs reviewed and stable Respiratory status: spontaneous breathing Cardiovascular status: stable Postop Assessment: no headache, epidural receding, patient able to bend at knees and no signs of nausea or vomiting Anesthetic complications: no Comments: Pt. States she is walking. Pain score 0.    No complications documented.  Last Vitals:  Vitals:   08/22/20 0139 08/22/20 0606  BP: (!) 102/54 (!) 95/41  Pulse: 95 95  Resp: 18 18  Temp: 36.7 C 36.8 C  SpO2:      Last Pain:  Vitals:   08/22/20 0606  TempSrc: Oral  PainSc:    Pain Goal:                Epidural/Spinal Function Cutaneous sensation: Normal sensation (08/22/20 0811)  Rico Sheehan

## 2020-08-23 MED ORDER — IBUPROFEN 800 MG PO TABS: 800.0000 mg | ORAL_TABLET | Freq: Three times a day (TID) | ORAL | 0 refills | Status: AC | PRN

## 2020-08-23 MED ORDER — FERROUS SULFATE 325 (65 FE) MG PO TABS: 325.0000 mg | ORAL_TABLET | Freq: Two times a day (BID) | ORAL | 3 refills | Status: AC

## 2020-08-23 MED ORDER — DOCUSATE SODIUM 100 MG PO CAPS: 100.0000 mg | ORAL_CAPSULE | Freq: Two times a day (BID) | ORAL | 3 refills | Status: AC

## 2020-08-23 NOTE — Discharge Summary (Addendum)
Postpartum Discharge Summary  08/23/20     Patient Name: Jodi Dennis DOB: [birth year] MRN: [id number]  Date of admission: 08/21/2020 Delivery date:08/21/2020  Delivering provider: Drema Dallas  Date of discharge: 08/23/2020  Admitting diagnosis: Premature rupture of membranes [O42.90] Intrauterine pregnancy: [redacted]w[redacted]d    Secondary diagnosis:  Active Problems:   Premature rupture of membranes  Additional problems: Ehlers-Danlos Syndrome, Anxiety/depression, Polyhydramnios    Discharge diagnosis: Term Pregnancy Delivered                                              Post partum procedures:None Augmentation: Pitocin Complications: None  Hospital course: Induction of Labor With Vaginal Delivery   30y.o. yo G2P2002 at 363w5das admitted to the hospital 08/21/2020 for induction of labor.  Indication for induction: PROM.  Patient had an uncomplicated labor course as follows: Membrane Rupture Time/Date: 1:15 PM ,08/21/2020   Delivery Method:Vaginal, Spontaneous  Episiotomy: None  Lacerations:  None  Details of delivery can be found in separate delivery note.  Patient had a routine postpartum course. Patient is discharged home 08/23/20.  Newborn Data: Birth date:08/21/2020  Birth time:10:14 PM  Gender:Female  Living status:Living  Apgars:9 ,9  Weight:2948 g   Magnesium Sulfate received: No BMZ received: No Rhophylac:No MMR:N/A T-DaP:Given prenatally Flu: No - Previously declined Transfusion:No  Physical exam  Vitals:   08/22/20 1045 08/22/20 1407 08/22/20 2105 08/23/20 0550  BP: (!) 98/58 (!) 96/52 106/61 99/62  Pulse: 64 66 68 77  Resp: 18 17 16 16   Temp: 97.8 F (36.6 C) 99 F (37.2 C) 98.2 F (36.8 C) 98.2 F (36.8 C)  TempSrc: Oral Oral Tympanic Axillary  SpO2:   100%   Weight:      Height:       General: alert, cooperative and no distress  Cardio:  RRR Lungs:  CTAB, no wheezes/rales/rhonchi Lochia: appropriate Uterine Fundus: firm Incision: N/A DVT Evaluation: No  evidence of DVT seen on physical exam. No cords or calf tenderness. Labs: Lab Results  Component Value Date   WBC 10.8 (H) 08/22/2020   HGB 9.0 (L) 08/22/2020   HCT 28.8 (L) 08/22/2020   MCV 68.7 (L) 08/22/2020   PLT 271 08/22/2020   CMP Latest Ref Rng & Units 02/18/2020  Glucose 70 - 99 mg/dL 91  BUN 6 - 20 mg/dL 6  Creatinine 0.44 - 1.00 mg/dL 0.59  Sodium 135 - 145 mmol/L 133(L)  Potassium 3.5 - 5.1 mmol/L 3.7  Chloride 98 - 111 mmol/L 102  CO2 22 - 32 mmol/L 23  Calcium 8.9 - 10.3 mg/dL 9.6  Total Protein 6.0 - 8.3 g/dL -  Total Bilirubin 0.3 - 1.2 mg/dL -  Alkaline Phos 39 - 117 U/L -  AST 0 - 37 U/L -  ALT 0 - 35 U/L -   Edinburgh Score: Edinburgh Postnatal Depression Scale Screening Tool 08/22/2020  I have been able to laugh and see the funny side of things. 0  I have looked forward with enjoyment to things. 0  I have blamed myself unnecessarily when things went wrong. 2  I have been anxious or worried for no good reason. 2  I have felt scared or panicky for no good reason. 1  Things have been getting on top of me. 1  I have been so unhappy that I have had difficulty sleeping. 0  I  have felt sad or miserable. 1  I have been so unhappy that I have been crying. 0  The thought of harming myself has occurred to me. 0  Edinburgh Postnatal Depression Scale Total 7      After visit meds:  Allergies as of 08/23/2020      Reactions   Penicillins Other (See Comments)   Childhood allergy, unknown reaction      Medication List    STOP taking these medications   metoCLOPramide 10 MG tablet Commonly known as: Reglan     TAKE these medications   docusate sodium 100 MG capsule Commonly known as: Colace Take 1 capsule (100 mg total) by mouth 2 (two) times daily.   escitalopram 10 MG tablet Commonly known as: LEXAPRO Take 10 mg by mouth daily.   ferrous sulfate 325 (65 FE) MG tablet Take 1 tablet (325 mg total) by mouth 2 (two) times daily with a meal.   ibuprofen  800 MG tablet Commonly known as: ADVIL Take 1 tablet (800 mg total) by mouth every 8 (eight) hours as needed for mild pain, moderate pain or cramping.   prenatal multivitamin Tabs tablet Take 1 tablet by mouth daily at 12 noon.        Discharge home in stable condition Infant Feeding: Breast Infant Disposition:home with mother Discharge instruction: per After Visit Summary and Postpartum booklet. Activity: Advance as tolerated. Pelvic rest for 6 weeks.  Diet: routine diet Anticipated Birth Control: None Postpartum Appointment:6 weeks Additional Postpartum F/U: Postpartum Depression checkup and 2 weeks  Future Appointments: Future Appointments  Date Time Provider [place]  09/23/2020  1:00 PM Cindra Presume, MD PSS-PSS None   Follow up Visit:  Follow-up Information    Drema Dallas, DO Follow up in 6 week(s).   Specialty: Obstetrics and Gynecology Why: A 6 week postpartum visit will be arranged for you.  Contact information: [address] [place] [address] [phone]                   08/23/2020 Drema Dallas, DO

## 2020-08-23 NOTE — Lactation Note (Signed)
This note was copied from a baby's chart.    Lactation Consultation Note  Patient Name: Boy Araiyah Cumpton [id number] Date: 08/23/2020   Age:30 hours  Mom's breasts are filling & nipples are atraumatic. Mom sometimes feels discomfort with latch. We discussed her latch technique; specifics of an asymmetric latch were shown via [employer].   Infant is only at 1% below BW with exclusive breastfeeding. Dad reports that stools "are changing."  Mom's questions were answered to her satisfaction. She knows how to reach [place] for any post-discharge questions. Mom has a DEBP at home ([employer]). Hand expression was taught to Mom.   Matthias Hughs [facility] 08/23/2020, 1:18 PM

## 2020-08-25 ENCOUNTER — Encounter (HOSPITAL_COMMUNITY): Payer: Self-pay | Admitting: Obstetrics and Gynecology

## 2020-08-25 ENCOUNTER — Other Ambulatory Visit: Payer: Self-pay

## 2020-08-25 ENCOUNTER — Inpatient Hospital Stay (HOSPITAL_COMMUNITY)
Admission: AD | Admit: 2020-08-25 | Discharge: 2020-08-25 | Disposition: A | Discharge status: Home / Self Care | Payer: BC Managed Care – PPO | Attending: Obstetrics and Gynecology | Admitting: Obstetrics and Gynecology

## 2020-08-25 DIAGNOSIS — O99893 Other specified diseases and conditions complicating puerperium: Secondary | ICD-10-CM | POA: Insufficient documentation

## 2020-08-25 DIAGNOSIS — Z880 Allergy status to penicillin: Secondary | ICD-10-CM | POA: Insufficient documentation

## 2020-08-25 DIAGNOSIS — Z711 Person with feared health complaint in whom no diagnosis is made: Secondary | ICD-10-CM | POA: Insufficient documentation

## 2020-08-25 DIAGNOSIS — R102 Pelvic and perineal pain: Secondary | ICD-10-CM | POA: Diagnosis present

## 2020-08-25 LAB — CBC
HCT: 29.3 % — ABNORMAL LOW (ref 36.0–46.0)
Hemoglobin: 8.9 g/dL — ABNORMAL LOW (ref 12.0–15.0)
MCH: 21.4 pg — ABNORMAL LOW (ref 26.0–34.0)
MCHC: 30.4 g/dL (ref 30.0–36.0)
MCV: 70.4 fL — ABNORMAL LOW (ref 80.0–100.0)
Platelets: 296 10*3/uL (ref 150–400)
RBC: 4.16 MIL/uL (ref 3.87–5.11)
RDW: 19.9 % — ABNORMAL HIGH (ref 11.5–15.5)
WBC: 9.7 10*3/uL (ref 4.0–10.5)
nRBC: 0 % (ref 0.0–0.2)

## 2020-08-25 LAB — SURGICAL PATHOLOGY

## 2020-08-25 NOTE — MAU Note (Signed)
Jodi Dennis is a 30 y.o. here in MAU reporting: s/p SVD on 08/21/20. States she has been feeling dizzy and weak since yesterday. States bleeding that she sees on her pad is brown and seems lighter but when she wipes the bleeding is bright red. Had 2 large clots. Having some pelvic pain.  Onset of complaint: yesterday  Pain score: 6/10  Vitals:   08/25/20 1210  BP: 111/66  Pulse: 100  Resp: 16  Temp: 98.9 F (37.2 C)  SpO2: 95%     Lab orders placed from triage: none

## 2020-08-25 NOTE — Discharge Instructions (Signed)

## 2020-08-25 NOTE — MAU Provider Note (Signed)
History     CSN: [id number]  Arrival date and time: 08/25/20 1154   None     Chief Complaint  Patient presents with  . Pelvic Pain  . Vaginal Bleeding  . Dizziness   HPI  Patient is a 30 year old who is 4 days postpartum from SVD. Reports feeling dizzy and weak for past day. Patient had an uncomplicated vaginal delivery and is breastfeeding. EBL was 250 ccs. Her postpartum day one hgb was 9.0. Patient reports that she is bleeding about the same as her period. Reports needing to change pads every 2-3 hours. Has had two clots that are less than the size of a plum and brought photos of clots. Reports feeling dizzy with ambulation but not at rest. No syncopal episodes. She endorses that she does not feel like she is eating and drinking enough. She does have her partner helping her at home with newborn.   No headaches or vision changes. No worsened abdominal pain. Patient was prescribed oral iron at discharge but has not yet started taking it.   OB History    Gravida  2   Para  2   Term  2   Preterm  0   AB  0   Living  2     SAB  0   IAB  0   Ectopic  0   Multiple  0   Live Births  2           Past Medical History:  Diagnosis Date  . Breast mass 2011   benign by [place]  . Depression   . Ehler's-Danlos syndrome   . Seizures ([place])    last sz when 30 yrs old, was febrile, happened twice    Past Surgical History:  Procedure Laterality Date  . BREAST LUMPECTOMY Right     Family History  Problem Relation Age of Onset  . Ehlers-Danlos syndrome Brother   . Ehlers-Danlos syndrome Father   . Ehlers-Danlos syndrome Paternal Aunt   . Kidney disease Sister     Social History   Tobacco Use  . Smoking status: Never Smoker  . Smokeless tobacco: Never Used  Vaping Use  . Vaping Use: Never used  Substance Use Topics  . Alcohol use: No  . Drug use: No    Allergies:  Allergies  Allergen Reactions  . Penicillins Other (See Comments)    Childhood allergy,  unknown reaction    Medications Prior to Admission  Medication Sig Dispense Refill Last Dose  . docusate sodium (COLACE) 100 MG capsule Take 1 capsule (100 mg total) by mouth 2 (two) times daily. 60 capsule 3 Past Week at Unknown time  . escitalopram (LEXAPRO) 10 MG tablet Take 10 mg by mouth daily.   08/24/2020 at Unknown time  . ibuprofen (ADVIL) 800 MG tablet Take 1 tablet (800 mg total) by mouth every 8 (eight) hours as needed for mild pain, moderate pain or cramping. 30 tablet 0 08/24/2020 at Unknown time  . ferrous sulfate 325 (65 FE) MG tablet Take 1 tablet (325 mg total) by mouth 2 (two) times daily with a meal. 60 tablet 3   . Prenatal Vit-Fe Fumarate-FA (PRENATAL MULTIVITAMIN) TABS Take 1 tablet by mouth daily at 12 noon. 30 tablet 3     Review of Systems  Constitutional: Negative for chills and fatigue.  Respiratory: Negative for shortness of breath.   Cardiovascular: Negative for chest pain.  Gastrointestinal: Positive for constipation. Negative for abdominal pain, diarrhea, nausea  and vomiting.  Neurological: Positive for dizziness. Negative for syncope and headaches.   Physical Exam   Blood pressure 111/66, pulse 100, temperature 98.9 F (37.2 C), temperature source Oral, resp. rate 16, height 5\' 2"  (1.575 m), weight 74.9 kg, SpO2 95 %, unknown if currently breastfeeding.  Physical Exam Vitals and nursing note reviewed.  Constitutional:      Appearance: Normal appearance.  Cardiovascular:     Rate and Rhythm: Normal rate.  Pulmonary:     Effort: Pulmonary effort is normal.  Abdominal:     Palpations: Abdomen is soft.     Comments: Fundus is firm 2 below umbilicus   Genitourinary:    Comments: No increased bleeding with fundal rub. Lochia is dark red and no clots visualized.  Neurological:     Mental Status: She is alert.  Psychiatric:        Mood and Affect: Mood normal.        Behavior: Behavior normal.     MAU Course  Procedures  MDM  Pt evaluated at  bedside VSS, fundus firm and no bleeding ilicited with fundal rub. Lochia visually inspected is appropriate.  Discussed normal postpartum course. Will check CBC   Hgb 8.9. Stable from delivery. Discussed with patient and reinforced importance of rest, increasing PO intake. Plan for her to start the PO iron she was discharged home with.   Stable for d/c home.   Assessment and Plan   Normal postpartum vaginal bleeding -discharge home on PO iron   Janet Berlin 08/25/2020, 12:40 PM

## 2020-09-23 ENCOUNTER — Ambulatory Visit: Payer: BC Managed Care – PPO | Admitting: Plastic Surgery

## 2020-11-25 ENCOUNTER — Ambulatory Visit: Payer: BC Managed Care – PPO | Admitting: Psychology

## 2022-04-07 ENCOUNTER — Institutional Professional Consult (permissible substitution): Payer: Medicaid Other | Admitting: Plastic Surgery

## 2022-04-07 ENCOUNTER — Telehealth: Payer: Self-pay | Admitting: *Deleted

## 2022-04-07 NOTE — Telephone Encounter (Signed)
Patient called in at 8:09am to cancel 8:15am appointment for today.

## 2022-05-12 ENCOUNTER — Institutional Professional Consult (permissible substitution): Payer: Medicaid Other | Admitting: Plastic Surgery

## 2022-05-13 IMAGING — US US EXTREM UP*L* LTD
1 series · 14 of 14 positions shown · non-contrast
Comparison: None.

CLINICAL DATA: Mass about the left clavicle since 4222.

EXAM:
ULTRASOUND LEFT UPPER EXTREMITY LIMITED
TECHNIQUE: Ultrasound examination of the upper extremity soft tissues was
performed in the area of clinical concern.

[Series 1: us extrem up*left* ltd · 0.06mm/px · 14 acquisitions, 14 frames shown]
[im 1/14]
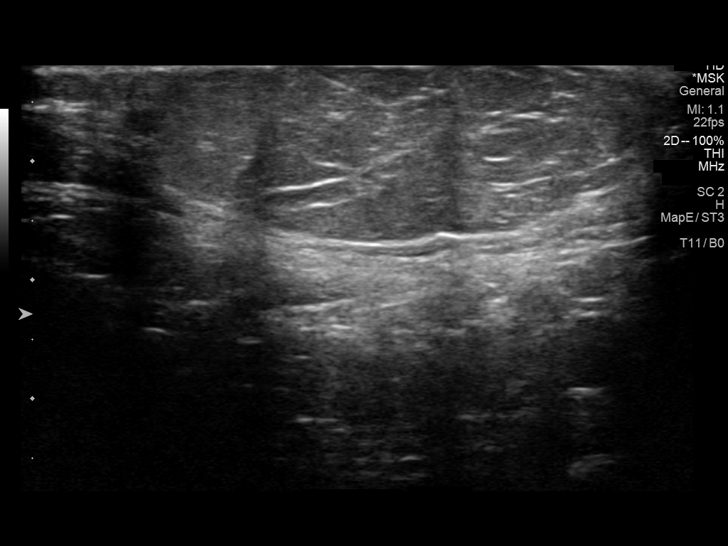
[im 2/14]
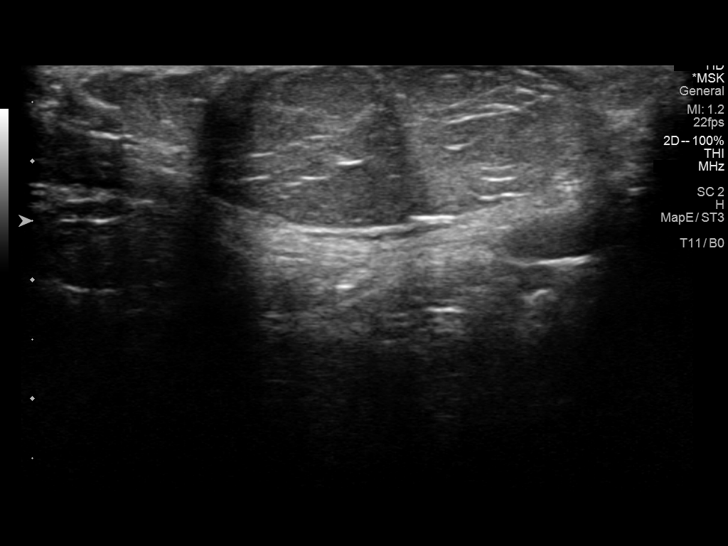
[im 3/14]
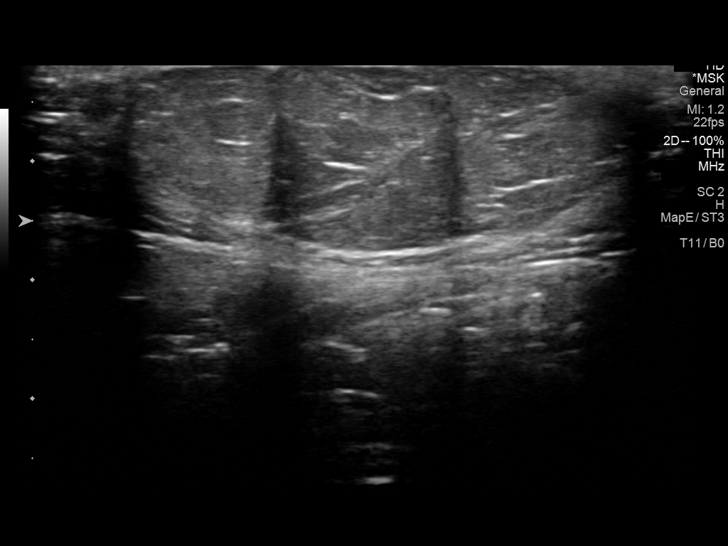
[im 4/14]
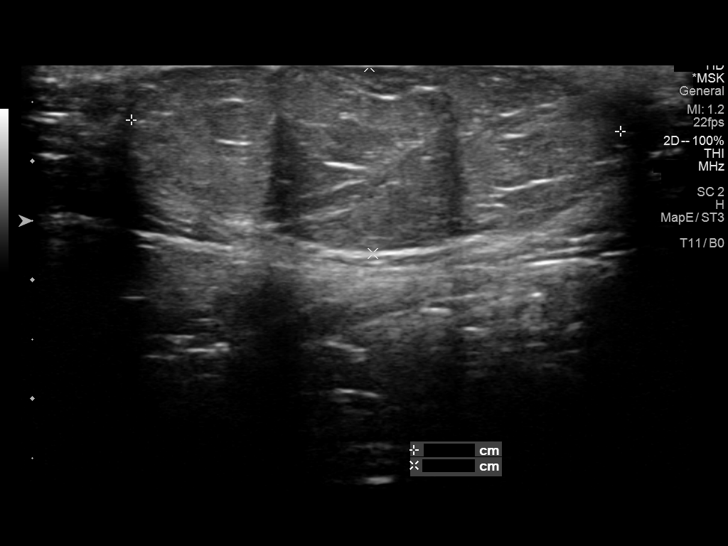
[im 5/14]
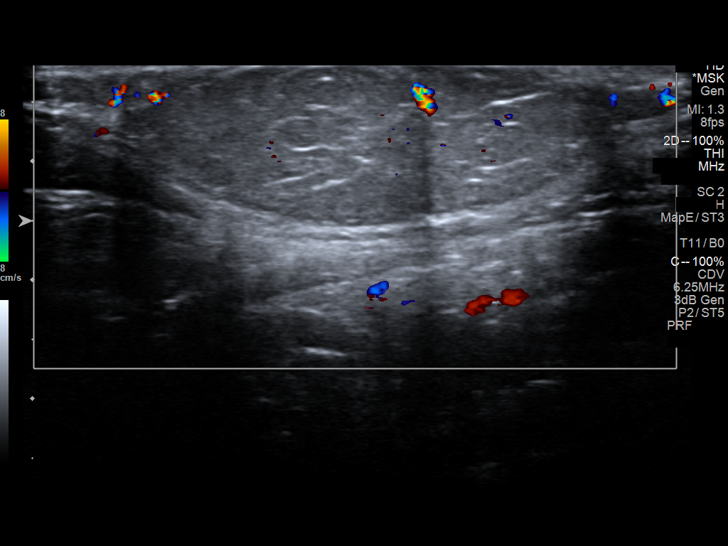
[im 6/14]
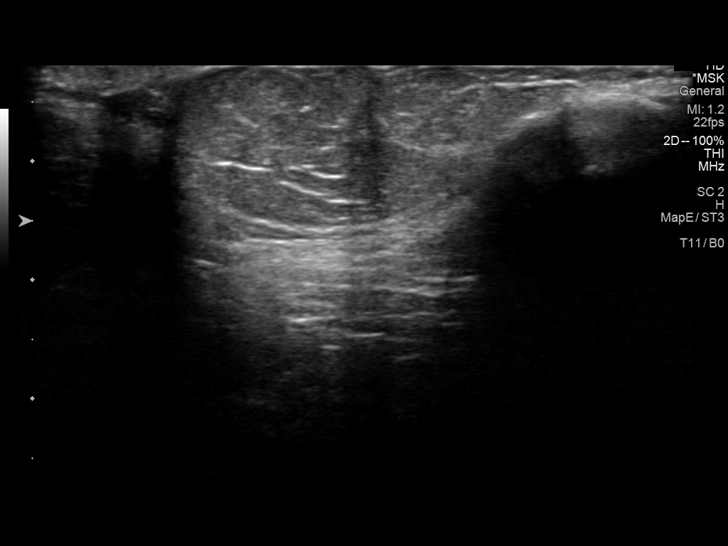
[im 7/14]
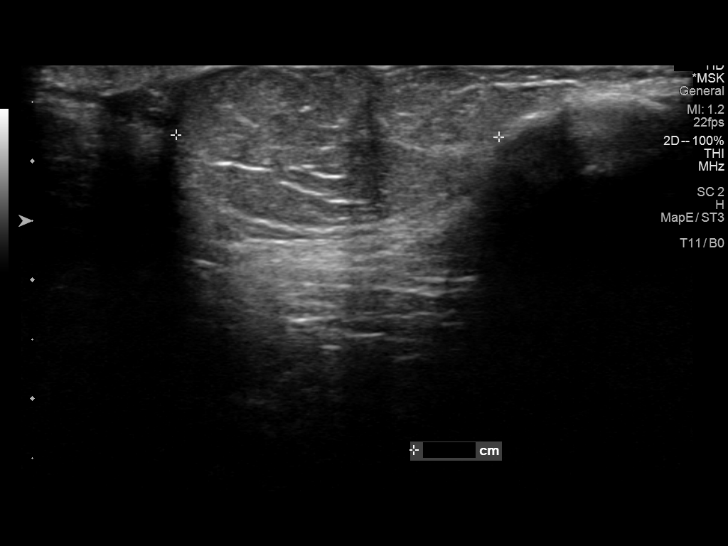
[im 8/14]
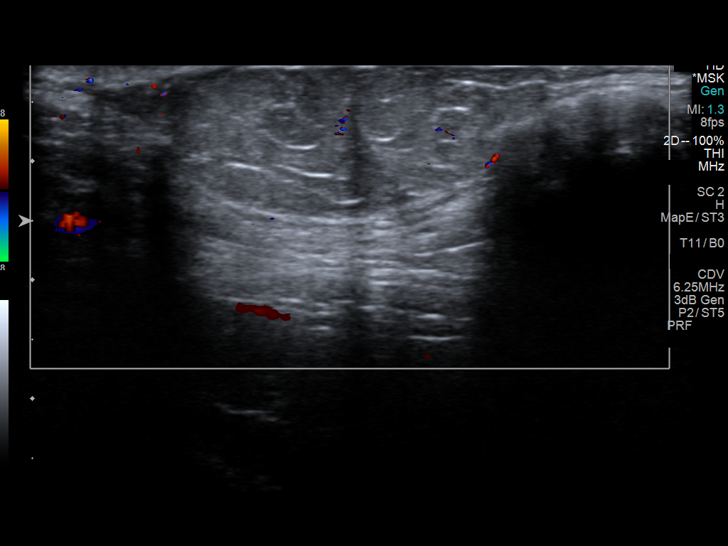
[im 9/14]
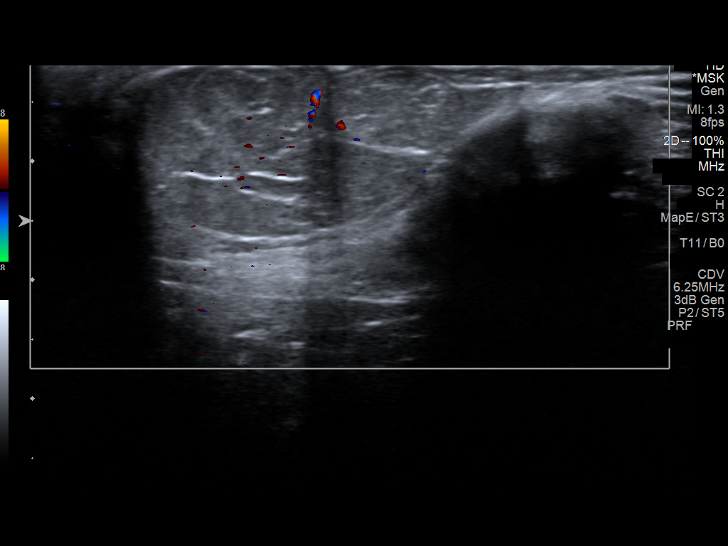
[im 10/14]
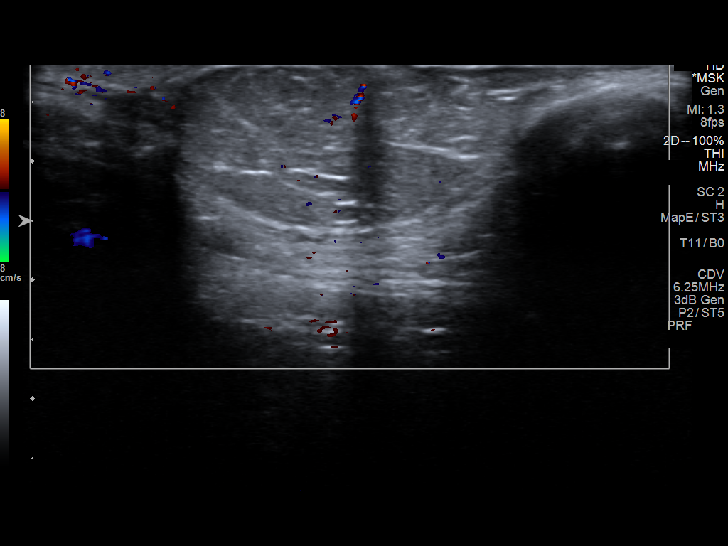
[im 11/14]
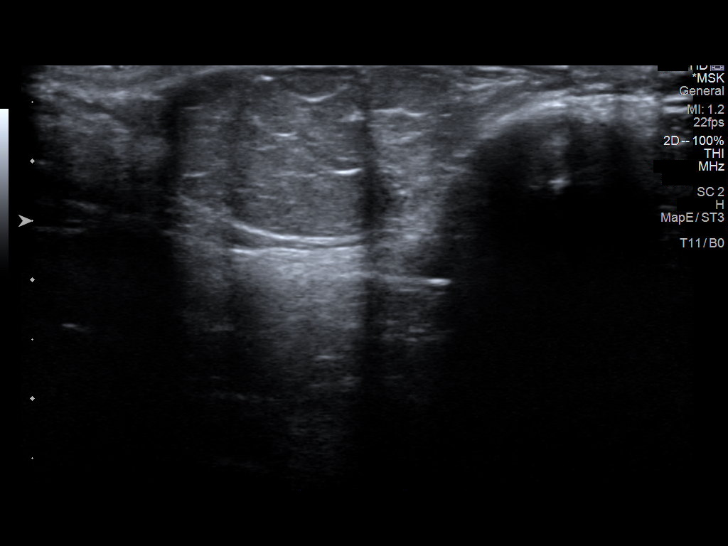
[im 12/14]
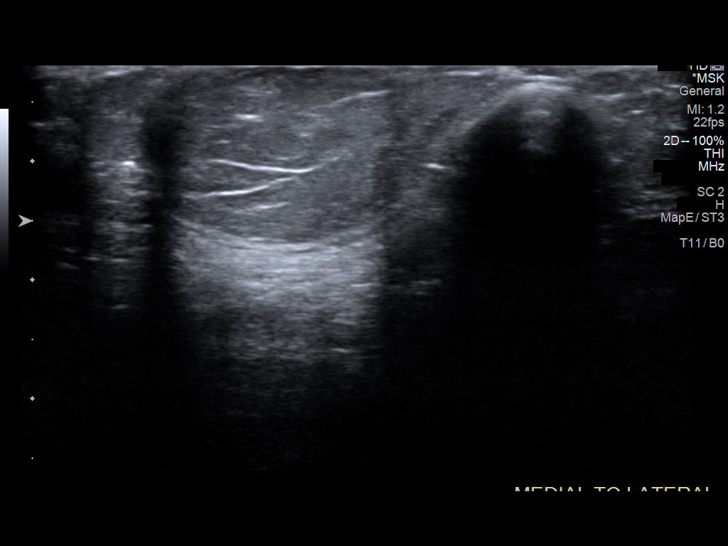
[im 13/14]
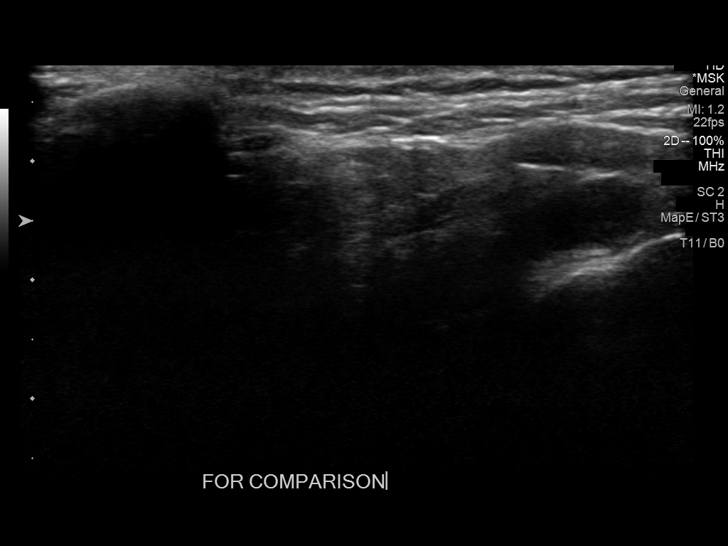
[im 14/14]
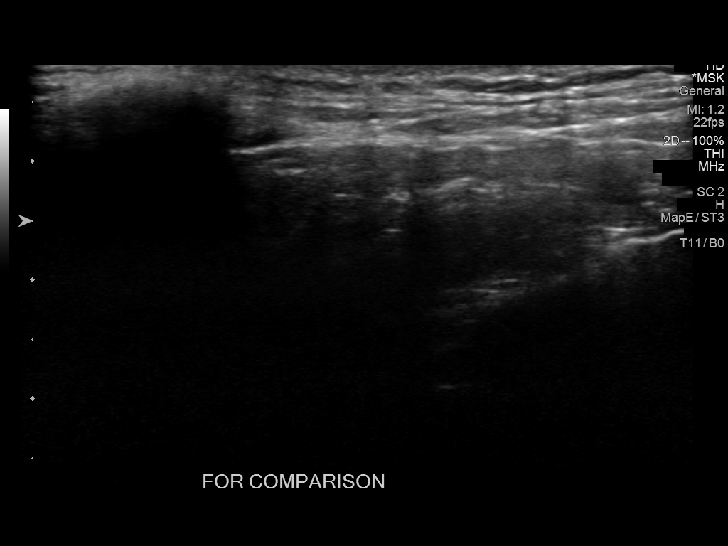

[14 of 14 positions shown; findings below may reference images not displayed]

FINDINGS: Scanning was directed toward the region of concern. A hyperechoic
subcutaneous lesion measuring 1.4 by 1.6 by 4.0 cm is identified and
consistent with a simple lipoma. No other mass is seen. No fluid
collection.
IMPRESSION: Findings consistent with a simple lipoma in the region of concern.

## 2022-06-14 ENCOUNTER — Ambulatory Visit: Payer: BC Managed Care – PPO | Admitting: Plastic Surgery

## 2022-06-14 ENCOUNTER — Encounter: Payer: Self-pay | Admitting: Plastic Surgery

## 2022-06-14 VITALS — BP 105/72 | HR 87 | Temp 98.7°F | Resp 16 | Ht 62.0 in | Wt 152.0 lb

## 2022-06-14 DIAGNOSIS — R2232 Localized swelling, mass and lump, left upper limb: Secondary | ICD-10-CM

## 2022-06-14 DIAGNOSIS — D1720 Benign lipomatous neoplasm of skin and subcutaneous tissue of unspecified limb: Secondary | ICD-10-CM

## 2022-06-14 NOTE — Progress Notes (Signed)
Jodi Dennis was seen in the office approximately 2 years ago for a lipoma on the anterior aspect of her left shoulder.  At at that time she was pregnant and was considering having the procedure done in the operating room.  The procedure was deferred due to her pregnancy.  She returns today for rescheduling.  She notes that the mass is continued to enlarge in size since she was seen last.  On physical exam she has a soft tissue mass consistent with a lipoma that is approximately 7 x 7 cm.  It is nontender and does not appear to be fixed to the tissues underneath the skin  .  Diagnosis is a presumed lipoma.  Will plan excision of the mass under local anesthesia in the office at her request.  She understands that there will be a scar, I have shown her where the scar will be.  She also understands that there is a risk of recurrence.  Stands that there is a slightly increased risk of excision of the mass in the clinic.  Will schedule at her request

## 2022-06-23 ENCOUNTER — Telehealth: Payer: Self-pay | Admitting: Plastic Surgery

## 2022-06-23 NOTE — Telephone Encounter (Signed)
Pt left message in office to r/s appt.  Returned call and lvm for her to call office to r/s appt.

## 2022-07-13 ENCOUNTER — Ambulatory Visit: Payer: Medicaid Other | Admitting: Plastic Surgery

## 2022-07-14 ENCOUNTER — Ambulatory Visit: Payer: BC Managed Care – PPO | Admitting: Plastic Surgery

## 2022-07-14 ENCOUNTER — Encounter: Payer: Self-pay | Admitting: Plastic Surgery

## 2022-07-14 VITALS — BP 100/63 | HR 68

## 2022-07-14 DIAGNOSIS — D1722 Benign lipomatous neoplasm of skin and subcutaneous tissue of left arm: Secondary | ICD-10-CM | POA: Diagnosis not present

## 2022-07-14 DIAGNOSIS — D1720 Benign lipomatous neoplasm of skin and subcutaneous tissue of unspecified limb: Secondary | ICD-10-CM

## 2022-07-14 NOTE — Progress Notes (Signed)
Procedure Note  Preoperative Dx: Soft tissue mass left shoulder  Postoperative Dx: Same  Procedure: Soft tissue mass left shoulder  Anesthesia: Lidocaine 1% with 1:100,000 epinephrine and 0.25% Sensorcaine   Indication for Procedure: Removal to treat pain, pathologic diagnosis  Description of Procedure: Risks and complications were explained to the patient including the risk of recurrence.  Consent was confirmed and the patient understands the risks and benefits.  The potential complications and alternatives were explained and the patient consents.  The patient expressed understanding the option of not having the procedure and the risks of a scar.  Time out was called and all information was confirmed to be correct.    The area was prepped and drapped.  Local anesthetic was injected in the subcutaneous tissues.  After waiting for the local to take affect a 4 cm curvilinear incision was made over the mass and dissection was carried out down to through the dermis and subcutaneous tissue to the mass.  The mass appeared to be a lipoma however it was multilobulated and required a significant amount of dissection to remove all the lobules.  The total size of the mass was approximately 5 x 5 cm..  After obtaining hemostasis, the surgical wound was closed in 3 layers with 3-0 Monocryl in the deep and subcutaneous tissues 3-0 Monocryl in the dermis and a running 4-0 Monocryl subcuticular stitch..  The surgical wound measured 4 cm.  A dressing was applied.  The patient was given instructions on how to care for the area and a follow up appointment.  Jodi Dennis tolerated the procedure well and there were no complications. The specimen was sent to pathology.

## 2022-07-20 NOTE — Progress Notes (Signed)
Patient is a 31 year old female with history of soft tissue mass to the left shoulder.  Patient underwent removal of the mass to the left shoulder on 07/14/2022 with Dr. Lovena Le.  In the procedure, the mass was removed and found to be approximately 5 x 5 cm in total size.  The wound was closed in 3 layers with 3-0 Monocryl in the deep and subcutaneous tissues, 3-0 Monocryl in the dermis and a running 4-0 Monocryl subcuticular stitch.  Specimen was sent to pathology.  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  Patient presents to the clinic today for postprocedural follow-up.  Today, patient reports she is doing well.  She states that she has had a little bit of irritation from a Band-Aid that she removed yesterday, but denies any other complaints or concerns.  She denies any drainage from the procedure site.  She denies any fevers or chills.  I discussed the results of the pathology with the patient.  Chaperone present on exam.  On exam, patient is sitting upright in no acute distress.  Procedure site to the left shoulder is intact and healing well.  There are no sutures visualized or palpated on exam.  There is no drainage from the incision site.  There are no fluid collections palpated on exam.  There is some mild erythema to the skin a few centimeters away from the incision that is consistent with irritation from the Band-Aid.  There is some mild bruising.  I discussed with the patient that she can apply Vaseline to her incision daily for the next 2 to 3 weeks.  I discussed with her that she can start using scar creams in about 2 to 3 weeks.  We discussed using scar cream such as Mederma or using a silicone-based scar cream such as Silagen.  Patient expressed understanding.  I also discussed with the patient to avoid direct sun exposure to the scar as this can worsen the scar.  I discussed with her that if she is going to be out in the sun, she should either cover the incisional scar or where  sunscreen to the area.  Patient expressed understanding.  I discussed with the patient that she can apply Vaseline or moisturizer to the area of irritation.  I discussed with her that if it does not improve, she can try using hydrocortisone cream.  I discussed with her to not apply the hydrocortisone cream to the incision.  I also discussed with the patient that if this area of irritation worsens or persist, to let us know.  Patient expressed understanding.  Patient to follow-up as needed.  I instructed the patient to call if she has any questions or concerns.

## 2022-07-21 ENCOUNTER — Ambulatory Visit: Payer: Medicaid Other | Admitting: Student

## 2022-07-21 DIAGNOSIS — D1720 Benign lipomatous neoplasm of skin and subcutaneous tissue of unspecified limb: Secondary | ICD-10-CM

## 2023-02-02 ENCOUNTER — Encounter (HOSPITAL_BASED_OUTPATIENT_CLINIC_OR_DEPARTMENT_OTHER): Payer: Self-pay | Admitting: Emergency Medicine

## 2023-02-02 ENCOUNTER — Emergency Department (HOSPITAL_BASED_OUTPATIENT_CLINIC_OR_DEPARTMENT_OTHER): Payer: BC Managed Care – PPO

## 2023-02-02 ENCOUNTER — Other Ambulatory Visit: Payer: Self-pay

## 2023-02-02 ENCOUNTER — Emergency Department (HOSPITAL_BASED_OUTPATIENT_CLINIC_OR_DEPARTMENT_OTHER)
Admission: EM | Admit: 2023-02-02 | Discharge: 2023-02-02 | Disposition: A | Discharge status: Home / Self Care | Payer: BC Managed Care – PPO | Attending: Emergency Medicine | Admitting: Emergency Medicine

## 2023-02-02 DIAGNOSIS — M79644 Pain in right finger(s): Secondary | ICD-10-CM | POA: Diagnosis present

## 2023-02-02 MED ORDER — CEPHALEXIN 500 MG PO CAPS: 500.0000 mg | ORAL_CAPSULE | Freq: Four times a day (QID) | ORAL | 0 refills | Status: DC

## 2023-02-02 MED ORDER — IBUPROFEN 600 MG PO TABS: 600.0000 mg | ORAL_TABLET | Freq: Four times a day (QID) | ORAL | 0 refills | Status: DC | PRN

## 2023-02-02 MED ORDER — CEPHALEXIN 500 MG PO CAPS: 500.0000 mg | ORAL_CAPSULE | Freq: Four times a day (QID) | ORAL | 0 refills | Status: AC

## 2023-02-02 MED ORDER — ACETAMINOPHEN 325 MG PO TABS
650.0000 mg | ORAL_TABLET | Freq: Four times a day (QID) | ORAL | 0 refills | Status: DC | PRN
Start: 2023-02-02 — End: 2023-02-02

## 2023-02-02 MED ORDER — IBUPROFEN 600 MG PO TABS: 600.0000 mg | ORAL_TABLET | Freq: Four times a day (QID) | ORAL | 0 refills | Status: AC | PRN

## 2023-02-02 MED ORDER — ACETAMINOPHEN 325 MG PO TABS: 650.0000 mg | ORAL_TABLET | Freq: Four times a day (QID) | ORAL | 0 refills | Status: AC | PRN

## 2023-02-02 NOTE — ED Triage Notes (Signed)
Pt c/o RT middle finger pain x 2d; no injury; sts swelling is increasing

## 2023-02-02 NOTE — ED Provider Notes (Signed)
[facility] Provider Note   CSN: [id number] Arrival date & time: 02/02/23  1855     History  Chief Complaint  Patient presents with   Hand Pain    Shaneia Gaymon is a 32 y.o. female presents today for evaluation of finger pain.  Symptoms started 2 days ago.  Denies any injury.  States that swelling is increasing.  Denies any loss of sensation to her finger.  No drainage noted.   Hand Pain    Past Medical History:  Diagnosis Date   Breast mass 2011   benign by [place]   Depression    Ehler's-Danlos syndrome    Seizures (HCC)    last sz when 32 yrs old, was febrile, happened twice   Past Surgical History:  Procedure Laterality Date   BREAST LUMPECTOMY Right      Home Medications Prior to Admission medications   Medication Sig Start Date End Date Taking? Authorizing Provider  docusate sodium (COLACE) 100 MG capsule Take 1 capsule (100 mg total) by mouth 2 (two) times daily. 08/23/20   Steva Ready, DO  escitalopram (LEXAPRO) 10 MG tablet Take 10 mg by mouth daily.    Aliene Beams, MD  ferrous sulfate 325 (65 FE) MG tablet Take 1 tablet (325 mg total) by mouth 2 (two) times daily with a meal. 08/23/20   Steva Ready, DO  ibuprofen (ADVIL) 800 MG tablet Take 1 tablet (800 mg total) by mouth every 8 (eight) hours as needed for mild pain, moderate pain or cramping. Patient not taking: Reported on 07/14/2022 08/23/20   Steva Ready, DO  Prenatal Vit-Fe Fumarate-FA (PRENATAL MULTIVITAMIN) TABS Take 1 tablet by mouth daily at 12 noon. 08/20/12   Tracey Harries, MD      Allergies    Penicillins    Review of Systems   Review of Systems Negative except as per HPI.  Physical Exam Updated Vital Signs BP 118/82 (BP Location: Left Arm)   Pulse (!) 56   Temp 98.5 F (36.9 C) (Oral)   Resp 14   Ht 5\' 2"  (1.575 m)   Wt 65.8 kg   LMP 01/19/2023   SpO2 100%   BMI 26.52 kg/m  Physical Exam Vitals and nursing note reviewed.   Constitutional:      Appearance: Normal appearance.  HENT:     Head: Normocephalic and atraumatic.     Mouth/Throat:     Mouth: Mucous membranes are moist.  Eyes:     General: No scleral icterus. Cardiovascular:     Rate and Rhythm: Normal rate and regular rhythm.     Pulses: Normal pulses.     Heart sounds: Normal heart sounds.  Pulmonary:     Effort: Pulmonary effort is normal.     Breath sounds: Normal breath sounds.  Abdominal:     General: Abdomen is flat.     Palpations: Abdomen is soft.     Tenderness: There is no abdominal tenderness.  Musculoskeletal:        General: No deformity.     Comments: Tenderness to palpation to the right middle finger with mild swelling and redness.  No abscess noted for drainage.  Skin:    General: Skin is warm.     Findings: No rash.  Neurological:     General: No focal deficit present.     Mental Status: She is alert.  Psychiatric:        Mood and Affect: Mood normal.  ED Results / Procedures / Treatments   Labs (all labs ordered are listed, but only abnormal results are displayed) Labs Reviewed - No data to display  EKG None  Radiology DG Finger Middle Right  Result Date: 02/02/2023 CLINICAL DATA:  Pain EXAM: RIGHT MIDDLE FINGER 2+V COMPARISON:  None Available. FINDINGS: There is no evidence of fracture or dislocation. There is no evidence of arthropathy or other focal bone abnormality. Soft tissues are unremarkable. IMPRESSION: Negative. Electronically Signed   By: Darliss Cheney M.D.   On: 02/02/2023 20:55    Procedures Procedures    Medications Ordered in ED Medications - No data to display  ED Course/ Medical Decision Making/ A&P                                 Medical Decision Making Amount and/or Complexity of Data Reviewed Radiology: ordered.   This patient presents to the ED for finger pain, this involves an extensive number of treatment options, and is a complaint that carries with a high risk of  complications and morbidity.  The differential diagnosis includes cellulitis,.  This is not an exhaustive list.  Imaging studies: I ordered imaging studies, personally reviewed, interpreted imaging and agree with the radiologist's interpretations. The results include: Negative x-ray of the right middle finger.  Problem list/ ED course/ Critical interventions/ Medical management: HPI: See above Vital signs within normal range and stable throughout visit. Laboratory/imaging studies significant for: See above. On physical examination, patient is afebrile and appears in no acute distress.  There was mild swelling, redness and tenderness to palpation to the right middle finger.  Neurovascular functions of the right hand intact.  Concerning for cellulitis.  No evidence of abscess, paronychia for drainage.  Will send an Rx of Keflex.  Advised patient to take Tylenol/ibuprofen/naproxen for pain, follow-up with primary care physician for further evaluation and management, return to the ER if new or worsening symptoms. I have reviewed the patient home medicines and have made adjustments as needed.  Cardiac monitoring/EKG: The patient was maintained on a cardiac monitor.  I personally reviewed and interpreted the cardiac monitor which showed an underlying rhythm of: sinus rhythm.  Additional history obtained: External records from outside source obtained and reviewed including: Chart review including previous notes, labs, imaging.  Consultations obtained:  Disposition Continued outpatient therapy. Follow-up with PCP recommended for reevaluation of symptoms. Treatment plan discussed with patient.  Pt acknowledged understanding was agreeable to the plan. Worrisome signs and symptoms were discussed with patient, and patient acknowledged understanding to return to the ED if they noticed these signs and symptoms. Patient was stable upon discharge.   This chart was dictated using voice recognition software.   Despite best efforts to proofread,  errors can occur which can change the documentation meaning.          Final Clinical Impression(s) / ED Diagnoses Final diagnoses:  Finger pain, right    Rx / DC Orders ED Discharge Orders     None         Jeanelle Malling, [place] 02/02/23 2113    Sloan Leiter, DO 02/05/23 2354

## 2023-02-02 NOTE — Discharge Instructions (Addendum)
Please take your antibiotics as prescribed.  Take tylenol/ibuprofen/naproxen for pain.  Use ice compresses to reduce swelling. I recommend close follow-up with PCP for reevaluation.  Please do not hesitate to return to emergency department if worrisome signs symptoms we discussed become apparent.

## 2023-02-02 NOTE — ED Notes (Signed)
Tried to self L&D her R index finger near the nail. Poked 2-3 times, no productive fluid. Swelling did not go down, has been swelling since then.
